# Patient Record
Sex: Female | Born: 1985 | Race: White | Hispanic: No | Marital: Single | Smoking: Never smoker
Health system: Southern US, Community
[De-identification: ages and names within clinical notes are randomized; demographics above are authoritative.]

## PROBLEM LIST (undated history)

## (undated) ENCOUNTER — Inpatient Hospital Stay (HOSPITAL_COMMUNITY): Payer: Self-pay

## (undated) DIAGNOSIS — A749 Chlamydial infection, unspecified: Secondary | ICD-10-CM

## (undated) DIAGNOSIS — R87629 Unspecified abnormal cytological findings in specimens from vagina: Secondary | ICD-10-CM

## (undated) HISTORY — PX: OTHER SURGICAL HISTORY: SHX169

## (undated) HISTORY — DX: Unspecified abnormal cytological findings in specimens from vagina: R87.629

## (undated) HISTORY — PX: COLPOSCOPY: SHX161

---

## 2007-09-29 ENCOUNTER — Encounter (INDEPENDENT_AMBULATORY_CARE_PROVIDER_SITE_OTHER): Payer: Self-pay | Admitting: Otolaryngology

## 2007-09-29 ENCOUNTER — Ambulatory Visit (HOSPITAL_BASED_OUTPATIENT_CLINIC_OR_DEPARTMENT_OTHER): Admission: RE | Admit: 2007-09-29 | Discharge: 2007-09-29 | Payer: Self-pay | Admitting: Otolaryngology

## 2009-03-10 ENCOUNTER — Emergency Department (HOSPITAL_COMMUNITY): Admission: EM | Admit: 2009-03-10 | Discharge: 2009-03-10 | Payer: Self-pay | Admitting: Emergency Medicine

## 2009-04-21 ENCOUNTER — Ambulatory Visit: Payer: Self-pay | Admitting: Advanced Practice Midwife

## 2009-04-21 ENCOUNTER — Inpatient Hospital Stay (HOSPITAL_COMMUNITY): Admission: AD | Admit: 2009-04-21 | Discharge: 2009-04-21 | Payer: Self-pay | Admitting: Obstetrics & Gynecology

## 2010-09-22 ENCOUNTER — Ambulatory Visit: Payer: Self-pay | Admitting: Obstetrics and Gynecology

## 2010-09-22 ENCOUNTER — Encounter: Payer: Self-pay | Admitting: Obstetrics & Gynecology

## 2010-09-22 ENCOUNTER — Other Ambulatory Visit: Admission: RE | Admit: 2010-09-22 | Discharge: 2010-09-22 | Payer: Self-pay | Admitting: Obstetrics & Gynecology

## 2010-09-23 ENCOUNTER — Encounter: Payer: Self-pay | Admitting: Obstetrics & Gynecology

## 2010-09-23 LAB — CONVERTED CEMR LAB: Yeast Wet Prep HPF POC: NONE SEEN

## 2010-10-05 ENCOUNTER — Ambulatory Visit: Payer: Self-pay | Admitting: Obstetrics & Gynecology

## 2011-03-01 LAB — POCT PREGNANCY, URINE: Preg Test, Ur: NEGATIVE

## 2011-03-27 LAB — CBC
Hemoglobin: 11.5 g/dL — ABNORMAL LOW (ref 12.0–15.0)
RBC: 3.88 MIL/uL (ref 3.87–5.11)
WBC: 12.3 10*3/uL — ABNORMAL HIGH (ref 4.0–10.5)

## 2011-03-27 LAB — HCG, QUANTITATIVE, PREGNANCY: hCG, Beta Chain, Quant, S: 4958 m[IU]/mL — ABNORMAL HIGH (ref <5)

## 2011-03-27 LAB — ABO/RH: ABO/RH(D): A POS

## 2011-05-01 NOTE — Op Note (Signed)
NAME:  Jillian George, Jillian George              ACCOUNT NO.:  [id number]   MEDICAL RECORD NO.:  [id number]          PATIENT TYPE:  AMB   LOCATION:  DSC                          FACILITY:  [facility]   PHYSICIAN:  Jefry H. Pollyann Kennedy, MD     DATE OF BIRTH:  1986/02/28   DATE OF PROCEDURE:  09/29/2007  DATE OF DISCHARGE:                               OPERATIVE REPORT   PREOPERATIVE DIAGNOSIS:  Right facial mass.   POSTOPERATIVE DIAGNOSES:  Right facial mass.   PROCEDURE:  Excision of right facial mass.   SURGEON:  Jefry H. Pollyann Kennedy, MD   ANESTHESIA:  General endotracheal.   COMPLICATIONS:  None.   ESTIMATED BLOOD LOSS:  None.   FINDINGS:  A cystic mass consistent with a sebaceous cyst in the right  preauricular skin in the subcutaneous plane superficial to the parotid  fascia.   HISTORY:  A 25 year old with a couple-year history of a slowly enlarging  mass on the right side of the face.  The risks, benefits,  alternatives  and complications to the procedure were explained to the patient and her  mother who seemed to underwent and agreed to surgery.   PROCEDURE:  The patient was taken to the operating room and placed on  the operating room table in supine position.  Following the induction of  general endotracheal anesthesia the right side of the face was prepped  and draped for a parotidectomy.  A preauricular incision with  continuation behind the earlobe and down along the upper cervical area  was outlined with a marking pen in case parotidectomy would be  necessary.  Xylocaine 1% with epinephrine was infiltrated into the skin  and subcutaneous tissue surrounding the lesion using only part of the  incision initially.  A #15 scalpel was used to incise the skin and  subcutaneous tissue.  Careful sharp dissection was accomplished around  the superficial aspect of the cyst.  At 1 point the cyst was ruptured,  and fluid leaked out, but none of the epithelial debris leaked out of  the small puncture  site.   The cyst was completely dissected from surrounding tissue keeping the  entire wall intact. The dissection was superficial to the parotid  fascia.  There were no branches of the facial nerve identified.  There  was no facial stimulation seen during the procedure.  The dissection was  kept anterior to the greater auricular nerve branches.  The  lesion was removed in its entirety and sent for pathologic evaluation.  Bipolar cautery was used for completion of hemostasis. The wound was  irrigated with saline and closed with a running 5-0 nylon suture.   The patient was then awakened, extubated and transferred to Recovery in  stable condition.      Jefry H. Pollyann Kennedy, MD  Electronically Signed     JHR/MEDQ  D:  09/29/2007  T:  09/29/2007  Job:  [id number]

## 2011-09-15 ENCOUNTER — Emergency Department (HOSPITAL_COMMUNITY): Payer: Self-pay

## 2011-09-15 ENCOUNTER — Emergency Department (HOSPITAL_COMMUNITY)
Admission: EM | Admit: 2011-09-15 | Discharge: 2011-09-15 | Disposition: A | Discharge status: Home / Self Care | Payer: Self-pay | Attending: Emergency Medicine | Admitting: Emergency Medicine

## 2011-09-15 DIAGNOSIS — R079 Chest pain, unspecified: Secondary | ICD-10-CM | POA: Insufficient documentation

## 2011-09-15 DIAGNOSIS — T07XXXA Unspecified multiple injuries, initial encounter: Secondary | ICD-10-CM | POA: Insufficient documentation

## 2011-09-15 DIAGNOSIS — M25569 Pain in unspecified knee: Secondary | ICD-10-CM | POA: Insufficient documentation

## 2011-09-15 DIAGNOSIS — IMO0002 Reserved for concepts with insufficient information to code with codable children: Secondary | ICD-10-CM | POA: Insufficient documentation

## 2011-09-15 DIAGNOSIS — F1010 Alcohol abuse, uncomplicated: Secondary | ICD-10-CM | POA: Insufficient documentation

## 2011-09-15 LAB — CBC
Hemoglobin: 13.3 g/dL (ref 12.0–15.0)
MCH: 31.1 pg (ref 26.0–34.0)
MCV: 92 fL (ref 78.0–100.0)
Platelets: 242 10*3/uL (ref 150–400)
RBC: 4.27 MIL/uL (ref 3.87–5.11)
WBC: 11.3 10*3/uL — ABNORMAL HIGH (ref 4.0–10.5)

## 2011-09-15 LAB — URINALYSIS, ROUTINE W REFLEX MICROSCOPIC
Glucose, UA: NEGATIVE mg/dL
Leukocytes, UA: NEGATIVE
Protein, ur: NEGATIVE mg/dL
Specific Gravity, Urine: 1.007 (ref 1.005–1.030)
Urobilinogen, UA: 0.2 mg/dL (ref 0.0–1.0)

## 2011-09-15 LAB — POCT I-STAT, CHEM 8
BUN: 6 mg/dL (ref 6–23)
Chloride: 106 mEq/L (ref 96–112)
Creatinine, Ser: 1 mg/dL (ref 0.50–1.10)
Potassium: 3.3 mEq/L — ABNORMAL LOW (ref 3.5–5.1)
Sodium: 144 mEq/L (ref 135–145)
TCO2: 20 mmol/L (ref 0–100)

## 2011-09-15 LAB — URINE MICROSCOPIC-ADD ON

## 2011-09-15 MED ORDER — IOHEXOL 300 MG/ML  SOLN
100.0000 mL | Freq: Once | INTRAMUSCULAR | Status: AC | PRN
  Administered 2011-09-15: 100 mL via INTRAVENOUS

## 2011-09-17 ENCOUNTER — Encounter: Payer: Self-pay | Admitting: Family Medicine
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
## 2013-12-17 NOTE — L&D Delivery Note (Signed)
Delivery Note 27yo [id number] @ 9644w5d who was admitted for SROM with irregular contractions.  Labor was augmented with Pitocin, the patient received an epidural for pain.  Due to difficulty monitoring contractions, an IUPC was placed.  She progressed to complete dilation and at 7:23 PM a viable female was delivered via Vaginal, Spontaneous Delivery (Presentation: ; Occiput Anterior).  APGAR: 8, 8; weight 5 lb 15.8 oz (2716 g).   Placenta status: Abnormal, Manual removal- Cord separated prematurely, manual extraction of placenta was performed in pieces.  Banjo curettage was gently used to ensure complete removal of placenta.  Uterus was noted to be firm. Cord: 3 vessels with the following complications: None.  Pregnancy complicated by: Subchorionic hematoma in first trimester, followed by US, remained stable and asymptomatic Size less than dates: US @ 591w1d- vertex/EFW: 3#15oz (46%)  Anesthesia: Epidural  Episiotomy: None Lacerations: Labial Suture Repair: 3.0 Est. Blood Loss (mL): 500  Mom to postpartum.  Baby to Couplet care / Skin to Skin.  Myna HidalgoZAN, Nishan Ovens, M 11/02/2014, 1:32 AM

## 2014-04-07 LAB — OB RESULTS CONSOLE ABO/RH: RH Type: POSITIVE

## 2014-04-07 LAB — OB RESULTS CONSOLE ANTIBODY SCREEN: Antibody Screen: NEGATIVE

## 2014-04-07 LAB — OB RESULTS CONSOLE RPR: RPR: NONREACTIVE

## 2014-04-07 LAB — OB RESULTS CONSOLE HIV ANTIBODY (ROUTINE TESTING): HIV: NONREACTIVE

## 2014-04-07 LAB — OB RESULTS CONSOLE HEPATITIS B SURFACE ANTIGEN: Hepatitis B Surface Ag: NEGATIVE

## 2014-04-07 LAB — OB RESULTS CONSOLE RUBELLA ANTIBODY, IGM: RUBELLA: IMMUNE

## 2014-04-19 ENCOUNTER — Other Ambulatory Visit: Payer: Self-pay | Admitting: Obstetrics & Gynecology

## 2014-04-19 ENCOUNTER — Other Ambulatory Visit (HOSPITAL_COMMUNITY)
Admission: RE | Admit: 2014-04-19 | Discharge: 2014-04-19 | Disposition: A | Discharge status: Home / Self Care | Payer: 59 | Source: Ambulatory Visit | Attending: Obstetrics & Gynecology | Admitting: Obstetrics & Gynecology

## 2014-04-19 DIAGNOSIS — Z113 Encounter for screening for infections with a predominantly sexual mode of transmission: Secondary | ICD-10-CM | POA: Insufficient documentation

## 2014-04-19 DIAGNOSIS — Z01419 Encounter for gynecological examination (general) (routine) without abnormal findings: Secondary | ICD-10-CM | POA: Insufficient documentation

## 2014-04-19 LAB — OB RESULTS CONSOLE GC/CHLAMYDIA
Chlamydia: NEGATIVE
Gonorrhea: NEGATIVE

## 2014-05-07 ENCOUNTER — Inpatient Hospital Stay (HOSPITAL_COMMUNITY): Payer: 59

## 2014-05-07 ENCOUNTER — Inpatient Hospital Stay (HOSPITAL_COMMUNITY)
Admission: AD | Admit: 2014-05-07 | Discharge: 2014-05-07 | Disposition: A | Discharge status: Home / Self Care | Payer: 59 | Source: Ambulatory Visit | Attending: Obstetrics & Gynecology | Admitting: Obstetrics & Gynecology

## 2014-05-07 ENCOUNTER — Encounter (HOSPITAL_COMMUNITY): Payer: Self-pay

## 2014-05-07 DIAGNOSIS — O209 Hemorrhage in early pregnancy, unspecified: Secondary | ICD-10-CM

## 2014-05-07 DIAGNOSIS — O208 Other hemorrhage in early pregnancy: Secondary | ICD-10-CM | POA: Insufficient documentation

## 2014-05-07 HISTORY — DX: Chlamydial infection, unspecified: A74.9

## 2014-05-07 NOTE — MAU Provider Note (Signed)
History     CSN: [id number]  Arrival date and time: 05/07/14 1613   None     Chief Complaint  Patient presents with  . Vaginal Bleeding   HPI This is a 28 y.o. female at [redacted]w[redacted]d who presents with c/o gush of blood while at work. Denies cramping. Bleeding has slowed now.   RN Note:  Patient states she was at work and had a little spotting. Put in a tampon then had a gush of bright red blood through the tampon, her panties and pants and made a puddle in the chair. No pain. Bleeding on arrival to MAU small amount on the pad.        OB History   Grav Para Term Preterm Abortions TAB SAB Ect Mult Living   3    2 1 1    0      Past Medical History  Diagnosis Date  . Chlamydia     Past Surgical History  Procedure Laterality Date  . Cyst removed from face      History reviewed. No pertinent family history.  History  Substance Use Topics  . Smoking status: Never Smoker   . Smokeless tobacco: Never Used  . Alcohol Use: No     Comment: None since March 2015    Allergies: No Known Allergies  Prescriptions prior to admission  Medication Sig Dispense Refill  . Prenatal Vit-Fe Fumarate-FA (PRENATAL MULTIVITAMIN) TABS tablet Take 1 tablet by mouth daily at 12 noon.        Review of Systems  Constitutional: Negative for fever, chills and malaise/fatigue.  Gastrointestinal: Negative for nausea, vomiting, abdominal pain, diarrhea and constipation.  Genitourinary:       Vaginal bleeding   Neurological: Negative for dizziness.   Physical Exam   Blood pressure 116/77, pulse 90, temperature 99 F (37.2 C), temperature source Oral, resp. rate 16, height 5' 4.5" (1.638 m), weight 63.231 kg (139 lb 6.4 oz), last menstrual period 02/10/2014.  Physical Exam  Constitutional: She is oriented to person, place, and time. She appears well-developed and well-nourished. No distress.  HENT:  Head: Normocephalic.  Cardiovascular: Normal rate.   Respiratory: Effort normal.  GI: Soft.  There is no tenderness. There is no rebound and no guarding.  Genitourinary: Uterus normal. Vaginal discharge found.  Moderate pooling of blood in vagina Cervix closed  Musculoskeletal: Normal range of motion.  Neurological: She is alert and oriented to person, place, and time.  Skin: Skin is warm and dry.  Psychiatric: She has a normal mood and affect.    MAU Course  Procedures  MDM  US Ob Comp Less 14 Wks  05/07/2014   CLINICAL DATA:  Assess viability.  EXAM: OBSTETRIC <14 WK ULTRASOUND  TECHNIQUE: Transabdominal ultrasound was performed for evaluation of the gestation as well as the maternal uterus and adnexal regions.  COMPARISON:  None.    FINDINGS: Intrauterine gestational sac: Present  Yolk sac:  Present  Embryo:  Present  Cardiac Activity: Present  Heart Rate: 165 bpm  CRL:   48  mm   11 w 5 d                    [place] EDC: 11/21/2014  Maternal uterus/adnexae: Normal bilateral ovaries. There is a 3.1 x 2.7 x 3.4 cm subchorionic hematoma. No significant free fluid identified within the pelvis.    IMPRESSION: Single live intrauterine gestation 12 weeks 2 days by LMP.  There is a 3.4 cm subchorionic hematoma.  Electronically Signed   By: Annia Beltrew  Davis M.D.   On: 05/07/2014 17:43   Results for Rosalyn GessHUTCHERSON, Kayren (MRN [id number]) as of 05/07/2014 21:27  Ref. Range 04/21/2009 18:40  ABO/RH(D) No range found A POS   Assessment and Plan  A:  SIUP at  3829w2d       First trimester bleeding      Subchorionic hemorrhage  P;  Discussed with Dr Richardson Doppole and patient       Pelvic rest       Bleeding precautions       Followup inoffice in 1-2 weeks  Aviva SignsMarie L Luman Holway 05/07/2014, 5:02 PM

## 2014-05-07 NOTE — Discharge Instructions (Signed)
° °Pelvic Rest °Pelvic rest is sometimes recommended for women when:  °· The placenta is partially or completely covering the opening of the cervix (placenta previa). °· There is bleeding between the uterine wall and the amniotic sac in the first trimester (subchorionic hemorrhage). °· The cervix begins to open without labor starting (incompetent cervix, cervical insufficiency). °· The labor is too early (preterm labor). °HOME CARE INSTRUCTIONS °· Do not have sexual intercourse, stimulation, or an orgasm. °· Do not use tampons, douche, or put anything in the vagina. °· Do not lift anything over 10 pounds (4.5 kg). °· Avoid strenuous activity or straining your pelvic muscles. °SEEK MEDICAL CARE IF:  °· You have any vaginal bleeding during pregnancy. Treat this as a potential emergency. °· You have cramping pain felt low in the stomach (stronger than menstrual cramps). °· You notice vaginal discharge (watery, mucus, or bloody). °· You have a low, dull backache. °· There are regular contractions or uterine tightening. °SEEK IMMEDIATE MEDICAL CARE IF: °You have vaginal bleeding and have placenta previa.  °Document Released: 03/30/2011 Document Revised: 02/25/2012 Document Reviewed: 03/30/2011 °ExitCare® Patient Information ©2014 [employer]. ° °Vaginal Bleeding During Pregnancy, First Trimester °A small amount of bleeding (spotting) from the vagina is relatively common in early pregnancy. It usually stops on its own. Various things may cause bleeding or spotting in early pregnancy. Some bleeding may be related to the pregnancy, and some may not. In most cases, the bleeding is normal and is not a problem. However, bleeding can also be a sign of something serious. Be sure to tell your health care provider about any vaginal bleeding right away. °Some possible causes of vaginal bleeding during the first trimester include: °· Infection or inflammation of the cervix. °· Growths (polyps) on the cervix. °· Miscarriage or  threatened miscarriage. °· Pregnancy tissue has developed outside of the uterus and in a fallopian tube (tubal pregnancy). °· Tiny cysts have developed in the uterus instead of pregnancy tissue (molar pregnancy). °HOME CARE INSTRUCTIONS  °Watch your condition for any changes. The following actions may help to lessen any discomfort you are feeling: °· Follow your health care provider's instructions for limiting your activity. If your health care provider orders bed rest, you may need to stay in bed and only get up to use the bathroom. However, your health care provider may allow you to continue light activity. °· If needed, make plans for someone to help with your regular activities and responsibilities while you are on bed rest. °· Keep track of the number of pads you use each day, how often you change pads, and how soaked (saturated) they are. Write this down. °· Do not use tampons. Do not douche. °· Do not have sexual intercourse or orgasms until approved by your health care provider. °· If you pass any tissue from your vagina, save the tissue so you can show it to your health care provider. °· Only take over-the-counter or prescription medicines as directed by your health care provider. °· Do not take aspirin because it can make you bleed. °· Keep all follow-up appointments as directed by your health care provider. °SEEK MEDICAL CARE IF: °· You have any vaginal bleeding during any part of your pregnancy. °· You have cramps or labor pains. °SEEK IMMEDIATE MEDICAL CARE IF:  °· You have severe cramps in your back or belly (abdomen). °· You have a fever, not controlled by medicine. °· You pass large clots or tissue from your vagina. °· Your bleeding   increases. °· You feel lightheaded or weak, or you have fainting episodes. °· You have chills. °· You are leaking fluid or have a gush of fluid from your vagina. °· You pass out while having a bowel movement. °MAKE SURE YOU: °· Understand these instructions. °· Will watch  your condition. °· Will get help right away if you are not doing well or get worse. °Document Released: 09/12/2005 Document Revised: 09/23/2013 Document Reviewed: 08/10/2013 °ExitCare® Patient Information ©2014 [employer]. ° °

## 2014-05-07 NOTE — MAU Note (Signed)
Patient states she was at work and had a little spotting. Put in a tampon then had a gush of bright red blood through the tampon, her panties and pants and made a puddle in the chair. No pain. Bleeding on arrival to MAU small amount on the pad.

## 2014-10-18 ENCOUNTER — Encounter (HOSPITAL_COMMUNITY): Payer: Self-pay

## 2014-10-21 LAB — OB RESULTS CONSOLE GBS: GBS: NEGATIVE

## 2014-10-31 ENCOUNTER — Inpatient Hospital Stay (HOSPITAL_COMMUNITY)
Admission: AD | Admit: 2014-10-31 | Discharge: 2014-11-02 | DRG: 767 | Disposition: A | Discharge status: Home / Self Care | Payer: 59 | Source: Ambulatory Visit | Attending: Obstetrics & Gynecology | Admitting: Obstetrics & Gynecology

## 2014-10-31 DIAGNOSIS — D62 Acute posthemorrhagic anemia: Secondary | ICD-10-CM | POA: Diagnosis not present

## 2014-10-31 DIAGNOSIS — Z3A37 37 weeks gestation of pregnancy: Secondary | ICD-10-CM | POA: Diagnosis present

## 2014-10-31 DIAGNOSIS — O9081 Anemia of the puerperium: Secondary | ICD-10-CM | POA: Diagnosis not present

## 2014-10-31 NOTE — MAU Note (Signed)
Pt reports leaking of fluid since early today. Denies UC's

## 2014-11-01 ENCOUNTER — Encounter (HOSPITAL_COMMUNITY): Payer: Self-pay | Admitting: *Deleted

## 2014-11-01 ENCOUNTER — Inpatient Hospital Stay (HOSPITAL_COMMUNITY): Payer: 59 | Admitting: Anesthesiology

## 2014-11-01 DIAGNOSIS — N858 Other specified noninflammatory disorders of uterus: Secondary | ICD-10-CM | POA: Diagnosis present

## 2014-11-01 DIAGNOSIS — Z3A37 37 weeks gestation of pregnancy: Secondary | ICD-10-CM | POA: Diagnosis present

## 2014-11-01 DIAGNOSIS — D62 Acute posthemorrhagic anemia: Secondary | ICD-10-CM | POA: Diagnosis not present

## 2014-11-01 DIAGNOSIS — O9081 Anemia of the puerperium: Secondary | ICD-10-CM | POA: Diagnosis not present

## 2014-11-01 LAB — CBC
HEMATOCRIT: 36 % (ref 36.0–46.0)
HEMOGLOBIN: 12.3 g/dL (ref 12.0–15.0)
MCH: 31.9 pg (ref 26.0–34.0)
MCHC: 34.2 g/dL (ref 30.0–36.0)
MCV: 93.3 fL (ref 78.0–100.0)
Platelets: 166 10*3/uL (ref 150–400)
RBC: 3.86 MIL/uL — AB (ref 3.87–5.11)
RDW: 12.8 % (ref 11.5–15.5)
WBC: 14.3 10*3/uL — ABNORMAL HIGH (ref 4.0–10.5)

## 2014-11-01 LAB — RPR

## 2014-11-01 LAB — POCT FERN TEST: POCT Fern Test: POSITIVE

## 2014-11-01 LAB — RAPID HIV SCREEN (WH-MAU): Rapid HIV Screen: NONREACTIVE

## 2014-11-01 MED ORDER — ACETAMINOPHEN 325 MG PO TABS: 650.0000 mg | ORAL_TABLET | ORAL | Status: DC | PRN

## 2014-11-01 MED ORDER — FENTANYL CITRATE 0.05 MG/ML IJ SOLN
INTRAMUSCULAR | Status: AC
  Administered 2014-11-01: 100 ug
  Filled 2014-11-01: qty 2

## 2014-11-01 MED ORDER — LANOLIN HYDROUS EX OINT: TOPICAL_OINTMENT | CUTANEOUS | Status: DC | PRN

## 2014-11-01 MED ORDER — OXYTOCIN 40 UNITS IN LACTATED RINGERS INFUSION - SIMPLE MED
62.5000 mL/h | INTRAVENOUS | Status: DC
  Administered 2014-11-01: 62.5 mL/h via INTRAVENOUS
  Filled 2014-11-01: qty 1000

## 2014-11-01 MED ORDER — CITRIC ACID-SODIUM CITRATE 334-500 MG/5ML PO SOLN: 30.0000 mL | ORAL | Status: DC | PRN

## 2014-11-01 MED ORDER — LACTATED RINGERS IV SOLN
INTRAVENOUS | Status: DC
  Administered 2014-11-01 (×3): via INTRAVENOUS
  Administered 2014-11-01: 125 mL/h via INTRAVENOUS

## 2014-11-01 MED ORDER — IBUPROFEN 600 MG PO TABS
600.0000 mg | ORAL_TABLET | Freq: Four times a day (QID) | ORAL | Status: DC
  Administered 2014-11-02 (×2): 600 mg via ORAL
  Filled 2014-11-01 (×4): qty 1

## 2014-11-01 MED ORDER — FENTANYL 2.5 MCG/ML BUPIVACAINE 1/10 % EPIDURAL INFUSION (WH - ANES)
14.0000 mL/h | INTRAMUSCULAR | Status: DC | PRN
  Administered 2014-11-01 (×2): 14 mL/h via EPIDURAL
  Filled 2014-11-01 (×3): qty 125

## 2014-11-01 MED ORDER — EPHEDRINE 5 MG/ML INJ
10.0000 mg | INTRAVENOUS | Status: DC | PRN
  Filled 2014-11-01: qty 2

## 2014-11-01 MED ORDER — LACTATED RINGERS IV SOLN
500.0000 mL | Freq: Once | INTRAVENOUS | Status: AC
  Administered 2014-11-01: 1000 mL via INTRAVENOUS

## 2014-11-01 MED ORDER — ONDANSETRON HCL 4 MG/2ML IJ SOLN: 4.0000 mg | INTRAMUSCULAR | Status: DC | PRN

## 2014-11-01 MED ORDER — WITCH HAZEL-GLYCERIN EX PADS: 1.0000 "application " | MEDICATED_PAD | CUTANEOUS | Status: DC | PRN

## 2014-11-01 MED ORDER — LIDOCAINE HCL (PF) 1 % IJ SOLN
INTRAMUSCULAR | Status: DC | PRN
  Administered 2014-11-01 (×2): 5 mL

## 2014-11-01 MED ORDER — OXYCODONE-ACETAMINOPHEN 5-325 MG PO TABS: 1.0000 | ORAL_TABLET | ORAL | Status: DC | PRN

## 2014-11-01 MED ORDER — ONDANSETRON HCL 4 MG PO TABS: 4.0000 mg | ORAL_TABLET | ORAL | Status: DC | PRN

## 2014-11-01 MED ORDER — ZOLPIDEM TARTRATE 5 MG PO TABS: 5.0000 mg | ORAL_TABLET | Freq: Every evening | ORAL | Status: DC | PRN

## 2014-11-01 MED ORDER — SENNOSIDES-DOCUSATE SODIUM 8.6-50 MG PO TABS
2.0000 | ORAL_TABLET | ORAL | Status: DC
  Filled 2014-11-01: qty 2

## 2014-11-01 MED ORDER — BENZOCAINE-MENTHOL 20-0.5 % EX AERO
1.0000 "application " | INHALATION_SPRAY | CUTANEOUS | Status: DC | PRN
  Filled 2014-11-01: qty 56

## 2014-11-01 MED ORDER — PHENYLEPHRINE 40 MCG/ML (10ML) SYRINGE FOR IV PUSH (FOR BLOOD PRESSURE SUPPORT)
80.0000 ug | PREFILLED_SYRINGE | INTRAVENOUS | Status: DC | PRN
Start: 2014-11-01 — End: 2014-11-01
  Filled 2014-11-01: qty 2

## 2014-11-01 MED ORDER — LACTATED RINGERS IV SOLN
500.0000 mL | INTRAVENOUS | Status: DC | PRN
  Administered 2014-11-01: 1000 mL via INTRAVENOUS

## 2014-11-01 MED ORDER — TERBUTALINE SULFATE 1 MG/ML IJ SOLN: 0.2500 mg | Freq: Once | INTRAMUSCULAR | Status: DC | PRN

## 2014-11-01 MED ORDER — DIPHENHYDRAMINE HCL 25 MG PO CAPS: 25.0000 mg | ORAL_CAPSULE | Freq: Four times a day (QID) | ORAL | Status: DC | PRN

## 2014-11-01 MED ORDER — OXYCODONE-ACETAMINOPHEN 5-325 MG PO TABS: 2.0000 | ORAL_TABLET | ORAL | Status: DC | PRN

## 2014-11-01 MED ORDER — DIBUCAINE 1 % RE OINT: 1.0000 "application " | TOPICAL_OINTMENT | RECTAL | Status: DC | PRN

## 2014-11-01 MED ORDER — ONDANSETRON HCL 4 MG/2ML IJ SOLN: 4.0000 mg | Freq: Four times a day (QID) | INTRAMUSCULAR | Status: DC | PRN

## 2014-11-01 MED ORDER — PRENATAL MULTIVITAMIN CH
1.0000 | ORAL_TABLET | Freq: Every day | ORAL | Status: DC
  Administered 2014-11-02: 1 via ORAL
  Filled 2014-11-01: qty 1

## 2014-11-01 MED ORDER — OXYTOCIN 40 UNITS IN LACTATED RINGERS INFUSION - SIMPLE MED
1.0000 m[IU]/min | INTRAVENOUS | Status: DC
  Administered 2014-11-01: 2 m[IU]/min via INTRAVENOUS

## 2014-11-01 MED ORDER — LIDOCAINE HCL (PF) 1 % IJ SOLN
30.0000 mL | INTRAMUSCULAR | Status: DC | PRN
  Filled 2014-11-01: qty 30

## 2014-11-01 MED ORDER — SIMETHICONE 80 MG PO CHEW
80.0000 mg | CHEWABLE_TABLET | ORAL | Status: DC | PRN
  Filled 2014-11-01: qty 1

## 2014-11-01 MED ORDER — PHENYLEPHRINE 40 MCG/ML (10ML) SYRINGE FOR IV PUSH (FOR BLOOD PRESSURE SUPPORT)
80.0000 ug | PREFILLED_SYRINGE | INTRAVENOUS | Status: DC | PRN
  Filled 2014-11-01: qty 10
  Filled 2014-11-01: qty 2

## 2014-11-01 MED ORDER — DIPHENHYDRAMINE HCL 50 MG/ML IJ SOLN: 12.5000 mg | INTRAMUSCULAR | Status: DC | PRN

## 2014-11-01 MED ORDER — OXYTOCIN BOLUS FROM INFUSION
500.0000 mL | INTRAVENOUS | Status: DC
  Administered 2014-11-01: 500 mL via INTRAVENOUS

## 2014-11-01 MED ORDER — FLEET ENEMA 7-19 GM/118ML RE ENEM: 1.0000 | ENEMA | RECTAL | Status: DC | PRN

## 2014-11-01 MED ORDER — FENTANYL CITRATE 0.05 MG/ML IJ SOLN
INTRAMUSCULAR | Status: AC
  Filled 2014-11-01: qty 2

## 2014-11-01 NOTE — Anesthesia Procedure Notes (Signed)
Epidural Patient location during procedure: OB Start time: 11/01/2014 8:05 AM  Staffing Anesthesiologist: Brayton CavesJACKSON, Fahmida Jurich Performed by: anesthesiologist   Preanesthetic Checklist Completed: patient identified, site marked, surgical consent, pre-op evaluation, timeout performed, IV checked, risks and benefits discussed and monitors and equipment checked  Epidural Patient position: sitting Prep: site prepped and draped and DuraPrep Patient monitoring: continuous pulse ox and blood pressure Approach: midline Location: L3-L4 Injection technique: LOR air  Needle:  Needle type: Tuohy  Needle gauge: 17 G Needle length: 9 cm and 9 Needle insertion depth: 5 cm cm Catheter type: closed end flexible Catheter size: 19 Gauge Catheter at skin depth: 10 cm Test dose: negative  Assessment Events: blood not aspirated, injection not painful, no injection resistance, negative IV test and no paresthesia  Additional Notes Patient identified.  Risk benefits discussed including failed block, incomplete pain control, headache, nerve damage, paralysis, blood pressure changes, nausea, vomiting, reactions to medication both toxic or allergic, and postpartum back pain.  Patient expressed understanding and wished to proceed.  All questions were answered.  Sterile technique used throughout procedure and epidural site dressed with sterile barrier dressing. No paresthesia or other complications noted.The patient did not experience any signs of intravascular injection such as tinnitus or metallic taste in mouth nor signs of intrathecal spread such as rapid motor block. Please see nursing notes for vital signs.

## 2014-11-01 NOTE — Progress Notes (Signed)
OB PN:  S: Pt resting comfortably, no acute complaints  O: BP 133/80 mmHg  Pulse 76  Temp(Src) 98.4 F (36.9 C) (Oral)  Resp 20  Ht 5\' 6"  (1.676 m)  Wt 78.019 kg (172 lb)  BMI 27.77 kg/m2  SpO2 99%  LMP 02/10/2014  FHT: 120bpm, moderate variablity, + accels 10x10 , no decels Toco: q1-432min SVE: ant lip/C/+2 per RN  A/P: 28 y.o. [id number]@G3P0020@ 6948w5d who presented in active labor 1. FWB: Cat. I  2. Labor: continue Pit per protocol Pain: continue with epidural GBS: negative  Myna HidalgoJennifer Carianna Lague, DO [phone] (pager) [phone] (office)

## 2014-11-01 NOTE — Progress Notes (Signed)
OB PN:  S: Pt resting comfortably, no acute complaints  O: BP 117/74 mmHg  Pulse 69  Temp(Src) 97.8 F (36.6 C) (Oral)  Resp 20  Ht 5\' 6"  (1.676 m)  Wt 78.019 kg (172 lb)  BMI 27.77 kg/m2  SpO2 99%  LMP 02/10/2014  FHT: 130bpm, minimal to moderate variablity, no accels, early decels Toco: q1-522min SVE: 3/75/-2, AROM of forebag clear fluid  A/P: 28 y.o. [id number]@G3P0020@ 6216w5d who presented in latent labor 1. FWB: Cat. II due to variability, likely due to tachysystole, will give fluid bolus and cut back on pitocin 2. Labor: continue Pit, will decrease slightly due to tachysystole Pain: continue with epidural GBS: negative  Myna HidalgoJennifer Alira Fretwell, DO [phone] (pager) [phone] (office)

## 2014-11-01 NOTE — MAU Provider Note (Signed)
S: Naveah Rodriges is a 28 y.o. G3P0020 at 7335w5d who presents today with leaking of fluid. She denies any VB. She confirms fetal movement. O: VSS, afebrile Abdomen: soft, non-tender, gravid External: no lesion Vagina: pooling of clear fluid  Cervix: 1/70/-2 Uterus: AGA FHT: 120, moderate with 15x15 accels, no decels  Toco: 2-4 mins, mild. Patient not aware  Fern: positive A/P: Exam for ROM RN will report to attending MD

## 2014-11-01 NOTE — Anesthesia Preprocedure Evaluation (Signed)

## 2014-11-01 NOTE — H&P (Signed)
HPI: 28 y/o G3P0020 @ 7171w5d estimated gestational age (as dated by LMP c/w 7 week ultrasound) presents complaining of leaking of fluid around 2:30am.   + Leaking of Fluid,   no Vaginal Bleeding,   irregular Uterine Contractions,  + Fetal Movement.  ROS: no HA, no epigastric pain, no visual changes.    Pregnancy complicated by: Subchorionic hematoma in first trimester, followed by US, remained stable and asymptomatic Size less than dates: US @ 583w1d- vertex/EFW: 3#15oz (46%)    Prenatal Transfer Tool  Maternal Diabetes: No Genetic Screening: Declined Maternal Ultrasounds/Referrals: Normal Fetal Ultrasounds or other Referrals:  None Maternal Substance Abuse:  No Significant Maternal Medications:  None Significant Maternal Lab Results: Lab values include: Group B Strep negative   PNL:  GBS negative, Rub Immune, Hep B neg, RPR NR, HIV neg, GC/C neg, glucola:normal, declined tetra Blood type: A positive Rc'd Tdap vaccine: 09/23/14  OBHx: primip PMHx:  none Meds:  PNV Allergy:  No Known Allergies SurgHx: none SocHx:   no Tobacco, no  EtOH, no Illicit Drugs  O: BP 140/78 mmHg  Pulse 107  Temp(Src) 98.1 F (36.7 C) (Oral)  Resp 18  Ht 5\' 6"  (1.676 m)  Wt 78.019 kg (172 lb)  BMI 27.77 kg/m2  LMP 02/10/2014 Gen. AAOx3, NAD CV.  RRR  No murmur.  Resp. CTAB, no wheeze or crackles. Abd. Gravid,  no tenderness,  no rigidity,  no guarding Extr.  no edema B/L , no calf tenderness  FHT: 115 baseline, moderate variability, + accels,  no decels Toco: q 3-5 min SVE: 1-2/60/-3 per RN   Labs:  CBC    Component Value Date/Time   WBC 14.3* 11/01/2014 0110   RBC 3.86* 11/01/2014 0110   HGB 12.3 11/01/2014 0110   HCT 36.0 11/01/2014 0110   PLT 166 11/01/2014 0110   MCV 93.3 11/01/2014 0110   MCH 31.9 11/01/2014 0110   MCHC 34.2 11/01/2014 0110   RDW 12.8 11/01/2014 0110    A/P:  28 y.o. G3P0020 @ 5471w5d EGA who presents for SROM in latent labor -FWB:  NICHD Cat I FHTs -Labor:  Pitocin was started due to irregular contractions, continue per protocol -GBS: negative -Pain management: plan for epidural this am  Myna HidalgoJennifer Kamarrion Stfort, DO [phone] (pager) [phone] (office)

## 2014-11-02 LAB — HEMOGLOBIN AND HEMATOCRIT, BLOOD
HEMATOCRIT: 22.2 % — AB (ref 36.0–46.0)
Hemoglobin: 7.5 g/dL — ABNORMAL LOW (ref 12.0–15.0)

## 2014-11-02 LAB — CBC
HCT: 25.9 % — ABNORMAL LOW (ref 36.0–46.0)
HEMOGLOBIN: 8.9 g/dL — AB (ref 12.0–15.0)
MCH: 32.2 pg (ref 26.0–34.0)
MCHC: 34.4 g/dL (ref 30.0–36.0)
MCV: 93.8 fL (ref 78.0–100.0)
Platelets: 163 10*3/uL (ref 150–400)
RBC: 2.76 MIL/uL — ABNORMAL LOW (ref 3.87–5.11)
RDW: 13 % (ref 11.5–15.5)
WBC: 26.3 10*3/uL — ABNORMAL HIGH (ref 4.0–10.5)

## 2014-11-02 MED ORDER — FERROUS SULFATE 325 (65 FE) MG PO TABS: 325.0000 mg | ORAL_TABLET | Freq: Two times a day (BID) | ORAL | Status: DC

## 2014-11-02 MED ORDER — IBUPROFEN 600 MG PO TABS: 600.0000 mg | ORAL_TABLET | Freq: Four times a day (QID) | ORAL | Status: DC

## 2014-11-02 MED ORDER — MISOPROSTOL 200 MCG PO TABS
200.0000 ug | ORAL_TABLET | Freq: Once | ORAL | Status: AC
  Administered 2014-11-02: 200 ug via ORAL
  Filled 2014-11-02: qty 1

## 2014-11-02 MED ORDER — MISOPROSTOL 200 MCG PO TABS: 200.0000 ug | ORAL_TABLET | Freq: Four times a day (QID) | ORAL | Status: DC

## 2014-11-02 MED ORDER — FERROUS SULFATE 325 (65 FE) MG PO TABS
325.0000 mg | ORAL_TABLET | Freq: Two times a day (BID) | ORAL | Status: DC
  Administered 2014-11-02 (×2): 325 mg via ORAL
  Filled 2014-11-02 (×2): qty 1

## 2014-11-02 NOTE — Discharge Summary (Signed)
Obstetric Discharge Summary Reason for Admission: rupture of membranes Prenatal Procedures: ultrasound Intrapartum Procedures: spontaneous vaginal delivery, manual extraction of placenta, bedside currettage Postpartum Procedures: None Complications-Operative and Postpartum: labial laceration HEMOGLOBIN  Date Value Ref Range Status  11/02/2014 7.5* 12.0 - 15.0 g/dL Final   HCT  Date Value Ref Range Status  11/02/2014 22.2* 36.0 - 46.0 % Final    Physical Exam:  See Progress notes.  Hg 12 to 8.9 to 7.5 VSS stable, No s/sxs of anemia.  Discharge Diagnoses: Term Pregnancy-delivered, Postpartum hemorrhage.  Discharge Information: Date: 11/02/2014 Activity: pelvic rest Diet: routine Medications: PNV, Ibuprofen, Iron and Cytotec Condition: stable Instructions: See discharge instructions. Discharge to: home Follow-up Information    Follow up with Myna HidalgoZAN, JENNIFER, M, DO. Schedule an appointment as soon as possible for a visit in 2 days.   Specialty:  Obstetrics and Gynecology   Why:  Check anemia   Contact information:   [address] [place] [id number] [phone]       Newborn Data: Live born female  Birth Weight: 5 lb 15.8 oz (2716 g) APGAR: 8, 8  [facility] D[facility] due to imperforate anus.  Geryl RankinsVARNADO, Toyoko Silos 11/02/2014, 5:57 PM

## 2014-11-02 NOTE — Progress Notes (Signed)
Postpartum day #1, NSVD  Subjective Overnight at ~0200, [facility].  Pt is requesting an early discharge to be with her baby. Pt without complaints.  Lochia normal, not passing clots.  Pain controlled.  Breast feeding yes.  Pt has received instructions how to pump.  Pt does not report lightheadedness or SOB.  Temp:  [97.8 F (36.6 C)-100.1 F (37.8 C)] 98.2 F (36.8 C) (11/17 0320) Pulse Rate:  [57-108] 89 (11/17 0320) Resp:  [18-20] 18 (11/17 0320) BP: (97-145)/(52-92) 117/63 mmHg (11/17 0320) SpO2:  [99 %-100 %] 99 % (11/17 0320)  Gen:  NAD, A&O x 3 Uterine fundus:  Firm, nontender Lochia:  Pad with scant amount of blood Ext:  1+Edema, no calf tenderness bilaterally  CBC    Component Value Date/Time   WBC 26.3* 11/02/2014 0600   RBC 2.76* 11/02/2014 0600   HGB 8.9* 11/02/2014 0600   HCT 25.9* 11/02/2014 0600   PLT 163 11/02/2014 0600   MCV 93.8 11/02/2014 0600   MCH 32.2 11/02/2014 0600   MCHC 34.4 11/02/2014 0600   RDW 13.0 11/02/2014 0600     A/P: S/p SVD, manual extraction of placenta with bedside curettage. PP hemorrhage.  Anemia but pt is asymptomatic. Newborn with imperforate anus.  [facility]  No concerns regarding bleeding at this time.  I recommend pt be observed overnight.  However, given the circumstances, I recommend a minimum of 24 hour observation to assess bleeding.  Bleeding precautions reviewed. Lactation support. Discharge possibly later today after reevaluating.  Geryl RankinsVARNADO, Jacia Sickman 11/02/2014, 8:32 AM

## 2014-11-02 NOTE — Discharge Instructions (Addendum)
Vaginal Delivery, Care After °Refer to this sheet in the next few weeks. These discharge instructions provide you with information on caring for yourself after delivery. Your caregiver may also give you specific instructions. Your treatment has been planned according to the most current medical practices available, but problems sometimes occur. Call your caregiver if you have any problems or questions after you go home. °HOME CARE INSTRUCTIONS °· Take over-the-counter or prescription medicines only as directed by your caregiver or pharmacist. °· Do not drink alcohol, especially if you are breastfeeding or taking medicine to relieve pain. °· Do not chew or smoke tobacco. °· Do not use illegal drugs. °· Continue to use good perineal care. Good perineal care includes: °· Wiping your perineum from front to back. °· Keeping your perineum clean. °· Do not use tampons or douche until your caregiver says it is okay. °· Shower, wash your hair, and take tub baths as directed by your caregiver. °· Wear a well-fitting bra that provides breast support. °· Eat healthy foods. °· Drink enough fluids to keep your urine clear or pale yellow. °· Eat high-fiber foods such as whole grain cereals and breads, brown rice, beans, and fresh fruits and vegetables every day. These foods may help prevent or relieve constipation. °· Follow your caregiver's recommendations regarding resumption of activities such as climbing stairs, driving, lifting, exercising, or traveling. °· Talk to your caregiver about resuming sexual activities. Resumption of sexual activities is dependent upon your risk of infection, your rate of healing, and your comfort and desire to resume sexual activity. °· Try to have someone help you with your household activities and your newborn for at least a few days after you leave the hospital. °· Rest as much as possible. Try to rest or take a nap when your newborn is sleeping. °· Increase your activities gradually. °· Keep  all of your scheduled postpartum appointments. It is very important to keep your scheduled follow-up appointments. At these appointments, your caregiver will be checking to make sure that you are healing physically and emotionally. °SEEK MEDICAL CARE IF:  °· You are passing large clots from your vagina. Save any clots to show your caregiver. °· You have a foul smelling discharge from your vagina. °· You have trouble urinating. °· You are urinating frequently. °· You have pain when you urinate. °· You have a change in your bowel movements. °· You have increasing redness, pain, or swelling near your vaginal incision (episiotomy) or vaginal tear. °· You have pus draining from your episiotomy or vaginal tear. °· Your episiotomy or vaginal tear is separating. °· You have painful, hard, or reddened breasts. °· You have a severe headache. °· You have blurred vision or see spots. °· You feel sad or depressed. °· You have thoughts of hurting yourself or your newborn. °· You have questions about your care, the care of your newborn, or medicines. °· You are dizzy or light-headed. °· You have a rash. °· You have nausea or vomiting. °· You were breastfeeding and have not had a menstrual period within 12 weeks after you stopped breastfeeding. °· You are not breastfeeding and have not had a menstrual period by the 12th week after delivery. °· You have a fever. °SEEK IMMEDIATE MEDICAL CARE IF:  °· You have persistent pain. °· You have chest pain. °· You have shortness of breath. °· You faint. °· You have leg pain. °· You have stomach pain. °· Your vaginal bleeding saturates two or more sanitary pads   in 1 hour. MAKE SURE YOU:   Understand these instructions.  Will watch your condition.  Will get help right away if you are not doing well or get worse. Document Released: 11/30/2000 Document Revised: 04/19/2014 Document Reviewed: 07/30/2012 [facility] Patient Information 2015 [place], [place]. This information is not intended to  replace advice given to you by your health care provider. Make sure you discuss any questions you have with your health care provider.  Postpartum Hemorrhage Postpartum hemorrhage is excessive blood loss after childbirth. Some blood loss is normal after delivering a baby. However, postpartum hemorrhage is a potentially serious condition.  CAUSES   A loss of muscle tone in the uterus after childbirth.  Failure to deliver all of the placenta.  Wounds in the birth canal caused by delivery of the fetus.  A maternal bleeding disorder that prevents blood clotting (rare). RISK FACTORS You are at greater risk for postpartum hemorrhage if you:  Have a history of postpartum hemorrhage.  Have delivered more than one baby.  Had preeclampsia or eclampsia.  Had problems with the placenta.  Had complications during your labor or delivery.  Are obese.  Are Asian or Hispanic. SIGNS AND SYMPTOMS  Vaginal bleeding after delivery is normal and should be expected. Bleeding (lochia) will occur for several days after childbirth. This can be expected with normal vaginal deliveries and cesarean deliveries.  You are bleeding too much after your delivery if you are:  Passing large clots or pieces of tissue. This may be small pieces of placenta left after delivery.   Soaking more than one sanitary pad per hour for several hours.   Having heavy, bright-red bleeding that occurs 4 days or more after delivery.   Having a discharge that has a bad smell or if you begin to run an unexplained fever.   Having times of lightheadedness or fainting, feeling short of breath, or having your heart beat fast with very little activity.  DIAGNOSIS  A diagnosis is based on your symptoms and a physical exam of your perineum, vagina, cervix, and uterus. Diagnostic tests may include:  Blood pressure and pulse.  Blood tests.  Blood clotting tests.  Ultrasonography. TREATMENT  Treatment is based on the  severity of bleeding and may include:  Uterine massage.  Medicines.  Blood transfusions.  Sometimes bleeding occurs if portions of the placenta are left behind in the uterus after delivery. If this happens, often a curettage or scraping of the inside of the uterus must be done. This usually stops the bleeding. If this treatment does not stop the bleeding, surgery (hysterectomy) may have to be performed to remove the uterus.  If bleeding is due to clotting or bleeding problems that are not related to the pregnancy, other treatments may be needed.  HOME CARE INSTRUCTIONS   Limit your activity as directed by your health care provider.Your health care provider may order bed rest (getting up to the bathroom only) or may allow you to continue light activity.   Keep track of the number of pads you use each day and how soaked (saturated) they are. Write this number down.   Do not use tampons. Do not douche or have sexual intercourse until approved by your health care provider.   Drink enough fluids to keep your urine clear or pale yellow.   Get proper amounts of rest.   Eat foods that are rich in iron, such as spinach, red meat, and legumes.  SEEK IMMEDIATE MEDICAL CARE IF:  You experience severe  cramps in your stomach, back, or belly (abdomen).   You have a fever.   You pass large clots or tissue. Save any tissue for your health care provider to look at.   Your bleeding increases.  You become weak or lightheaded, or you pass out.   Your sanitary pad count per hour is increasing. MAKE SURE YOU:  Understand these instructions.  Will watch your condition.  Will get help right away if you are not doing well or get worse. Document Released: 02/23/2004 Document Revised: 12/08/2013 Document Reviewed: 05/21/2013 [facility] Patient Information 2015 [place], [place]. This information is not intended to replace advice given to you by your health care provider. Make sure you  discuss any questions you have with your health care provider.

## 2014-11-02 NOTE — Anesthesia Postprocedure Evaluation (Signed)
Anesthesia Post Note  Patient: Jillian George  Procedure(s) Performed: * No procedures listed *  Anesthesia type: Epidural  Patient location: Mother/Baby  Post pain: Pain level controlled  Post assessment: Post-op Vital signs reviewed  Last Vitals:  Filed Vitals:   11/02/14 0320  BP: 117/63  Pulse: 89  Temp: 36.8 C  Resp: 18    Post vital signs: Reviewed  Level of consciousness: awake  Complications: No apparent anesthesia complications

## 2014-11-02 NOTE — Plan of Care (Signed)
Problem: Phase I Progression Outcomes Goal: Pain controlled with appropriate interventions Outcome: Completed/Met Date Met:  11/02/14 Goal: OOB as tolerated unless otherwise ordered Outcome: Completed/Met Date Met:  11/02/14 Goal: Initial discharge plan identified Outcome: Completed/Met Date Met:  11/02/14

## 2014-11-02 NOTE — Progress Notes (Addendum)
Informed by RN that Hg at 1500 was 7.5.  On admission was 12.5.  0600 today 8.9. Discussed with RN.  States that pt's lochia has been normal, uterus firm.  Pt has denied symptoms of anemia.She is taking iron.  VS were stable. Cytotec ordered po once.  Will call and speak to pt regarding her lab tests and severe anemia.  Will encourage repeat hg in am and discharge in am.

## 2014-11-02 NOTE — Progress Notes (Signed)
Postpartum day #1, NSVD  Subjective  In to reassess pt. Pt says bleeding is still normal.  Not passing clots, no SOB, dizzy.  Current pad has been in place ~ 2 hours.  Temp:  [98.2 F (36.8 C)-100.1 F (37.8 C)] 98.2 F (36.8 C) (11/17 0320) Pulse Rate:  [73-108] 89 (11/17 0320) Resp:  [18-20] 18 (11/17 0320) BP: (104-145)/(53-91) 117/63 mmHg (11/17 0320) SpO2:  [99 %-100 %] 99 % (11/17 0320)  Gen:  NAD, A&O x 3 Uterine fundus:  Firm, nontender Lochia:  Moderate amount of blood on ice pack Ext:  Not assessed  CBC    Component Value Date/Time   WBC 26.3* 11/02/2014 0600   RBC 2.76* 11/02/2014 0600   HGB 7.5* 11/02/2014 1503   HCT 22.2* 11/02/2014 1503   PLT 163 11/02/2014 0600   MCV 93.8 11/02/2014 0600   MCH 32.2 11/02/2014 0600   MCHC 34.4 11/02/2014 0600   RDW 13.0 11/02/2014 0600     A/P: S/p SVD, manual extraction of placenta, bedside currettage. PP hemorrhage.  Lochia appears normal.  No signs or symptoms of uterine atony. Recheck CBC.  If stable, discharge home.  Geryl RankinsVARNADO, Lorraine Terriquez 11/02/2014, 5:14 PM

## 2014-11-02 NOTE — Lactation Note (Signed)
Lactation Consultation Note  Assisted mother with hand expression. Able to easily express 20 ml of colostrum.  Mother plans to contact her insurance company to find out if she has breast pump benefits.  I explained the 2 week loaner program to her and the monthly rental program.  Once she finds out her coverage she may decide whether she will rent or purchase a breast pump. Patient Name: Jillian George ZOXWR'UToday's Date: 11/02/2014     Maternal Data    Feeding    LATCH Score/Interventions                      Lactation Tools Discussed/Used     Consult Status      Jillian George, Jillian George 11/02/2014, 4:18 PM

## 2014-11-02 NOTE — Consult Note (Signed)
Lactation consult note - [facility] She is expressing large amounts of colostrum. I rented her a 2 week symphony DEP and instructed her in it's use. NICU booklet on providing EBm given to mom and briefly reviewed. Mom knows she can call lactation once baby is discharged and home, for questions/concerns and o/p consults, prn.

## 2014-11-02 NOTE — Progress Notes (Signed)
Patient ordered Hgb and Hct lab work  by Dr. Dion BodyVarnado. Results phoned to Dr. Dion BodyVarnado. Hgb 7.5 and Hct 22.2

## 2014-11-02 NOTE — Plan of Care (Signed)
Problem: Discharge Progression Outcomes Goal: Barriers To Progression Addressed/Resolved Outcome: Completed/Met Date Met:  11/02/14 Goal: Activity appropriate for discharge plan Outcome: Completed/Met Date Met:  11/02/14 Goal: Tolerating diet Outcome: Completed/Met Date Met:  30/14/[birth year] Goal: Complications resolved/controlled Outcome: Completed/Met Date Met:  11/02/14 Goal: Pain controlled with appropriate interventions Outcome: Completed/Met Date Met:  11/02/14 Goal: Afebrile, VS remain stable at discharge Outcome: Completed/Met Date Met:  11/02/14 Goal: Discharge plan in place and appropriate Outcome: Completed/Met Date Met:  11/02/14 Goal: Other Discharge Outcomes/Goals Outcome: Completed/Met Date Met:  11/02/14

## 2014-11-02 NOTE — Plan of Care (Signed)
Problem: Consults Goal: Postpartum Patient Education (See Patient Education module for education specifics.)  Outcome: Completed/Met Date Met:  11/02/14

## 2014-11-02 NOTE — Plan of Care (Signed)
Problem: Phase I Progression Outcomes Goal: Voiding adequately Outcome: Completed/Met Date Met:  11/02/14 Goal: VS, stable, temp < 100.4 degrees F Outcome: Completed/Met Date Met:  11/02/14 Goal: Other Phase I Outcomes/Goals Outcome: Completed/Met Date Met:  11/02/14  Problem: Phase II Progression Outcomes Goal: Pain controlled on oral analgesia Outcome: Completed/Met Date Met:  11/02/14 Goal: Afebrile, VS remain stable Outcome: Completed/Met Date Met:  11/02/14 Goal: Tolerating diet Outcome: Completed/Met Date Met:  11/02/14 Goal: Other Phase II Outcomes/Goals Outcome: Completed/Met Date Met:  11/02/14

## 2014-11-02 NOTE — Plan of Care (Signed)
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 Patient had been tearful per night shift RN. Patient has a good support system and would like to be discharged this evening around 24 hour delivery time if OB MD is good with her progression postpartum,

## 2014-11-14 MED ORDER — FENTANYL CITRATE 0.05 MG/ML IJ SOLN: 100.0000 ug | Freq: Once | INTRAMUSCULAR | Status: AC

## 2016-04-07 IMAGING — US US OB COMP LESS 14 WK
1 series · 14 of 23 positions shown · non-contrast
Comparison: None.

CLINICAL DATA: Assess viability.

EXAM:
OBSTETRIC <14 WK ULTRASOUND
TECHNIQUE: Transabdominal ultrasound was performed for evaluation of the
gestation as well as the maternal uterus and adnexal regions.

[Series 1: us ob comp less 14 wks · 23 acquisitions, 14 frames shown]
[im 1/23]
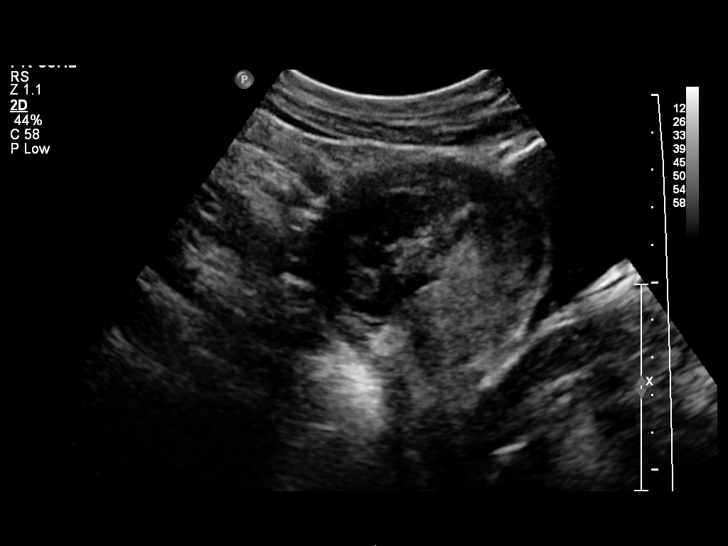
[im 3/23]
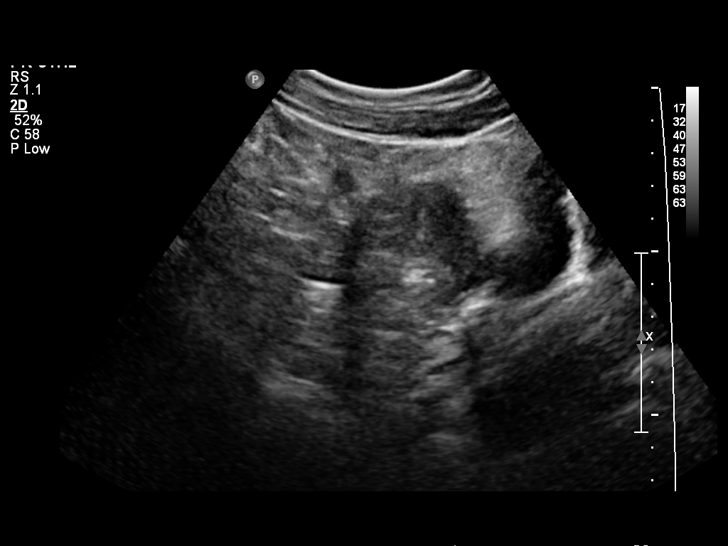
[im 5/23]
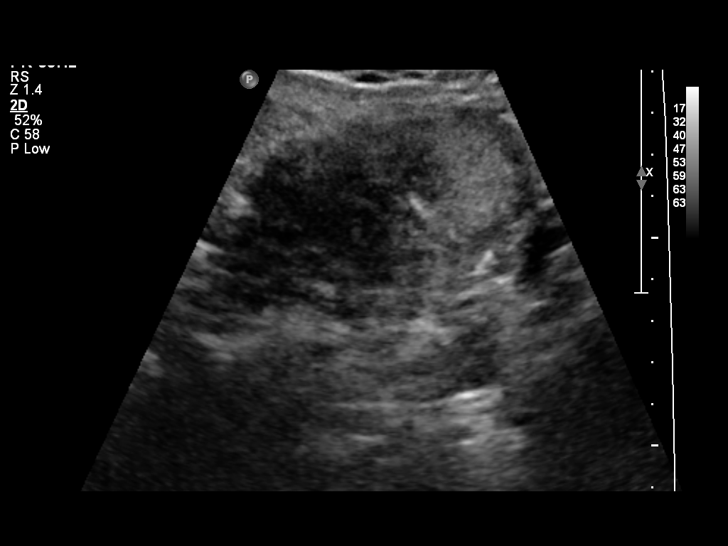
[im 6/23]
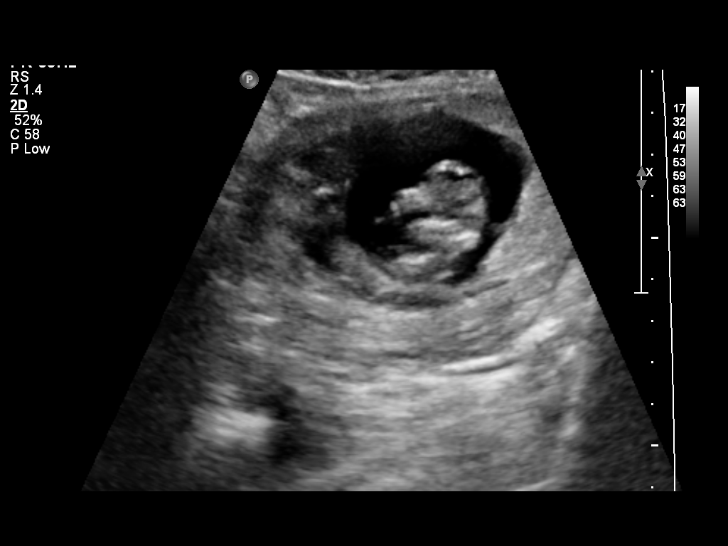
[im 8/23]
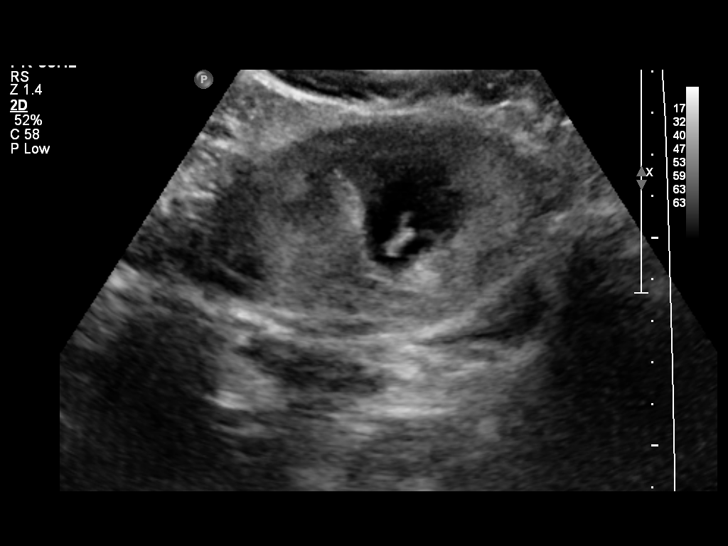
[im 10/23]
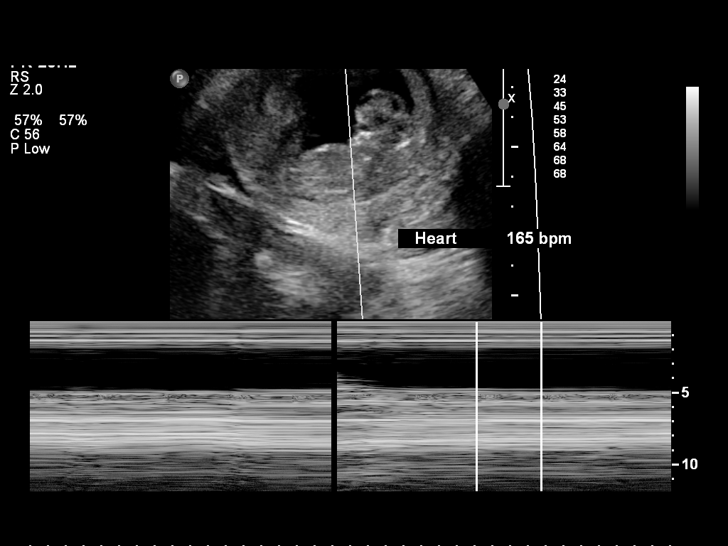
[im 11/23]
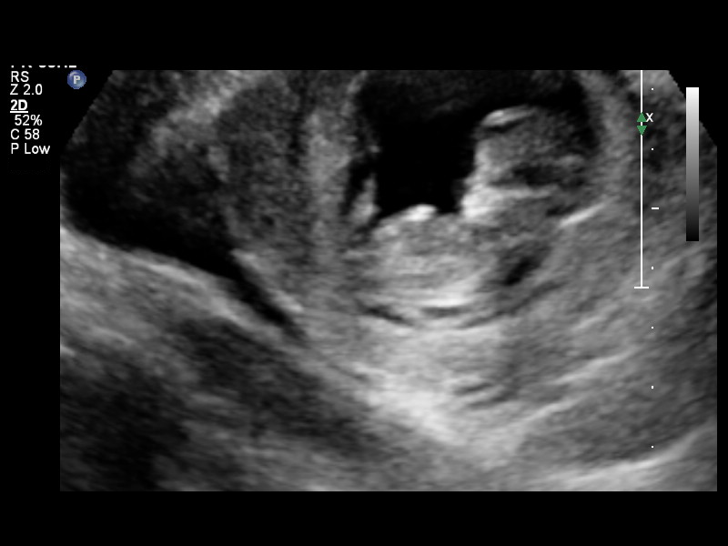
[im 13/23]
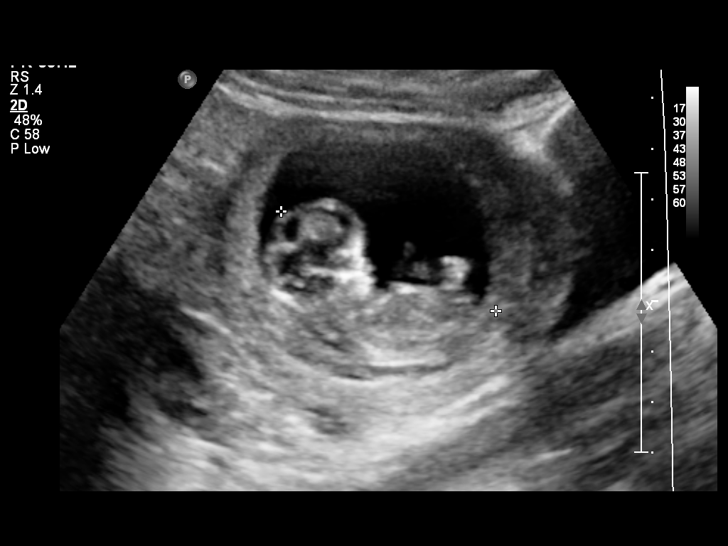
[im 14/23]
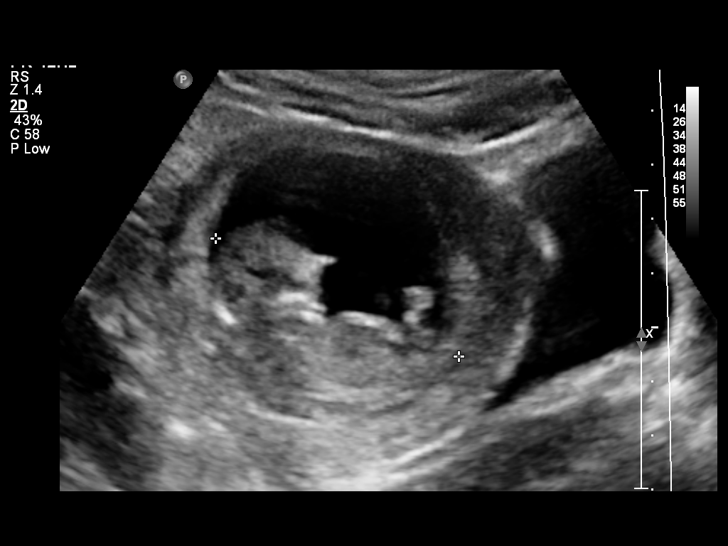
[im 16/23]
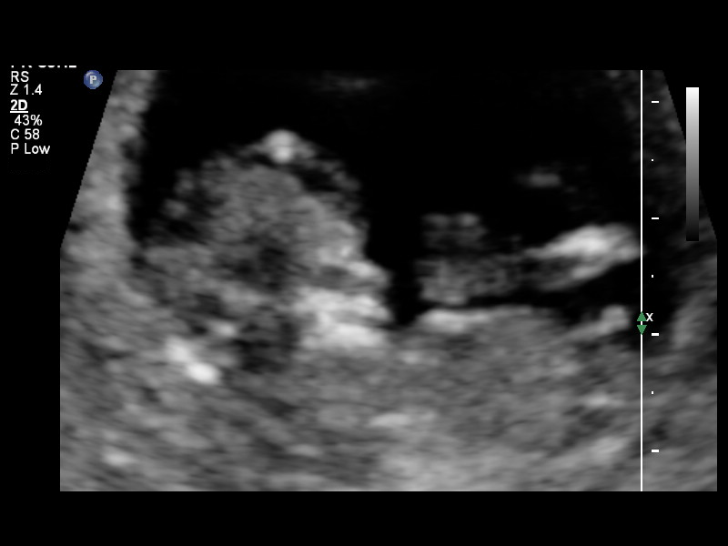
[im 18/23]
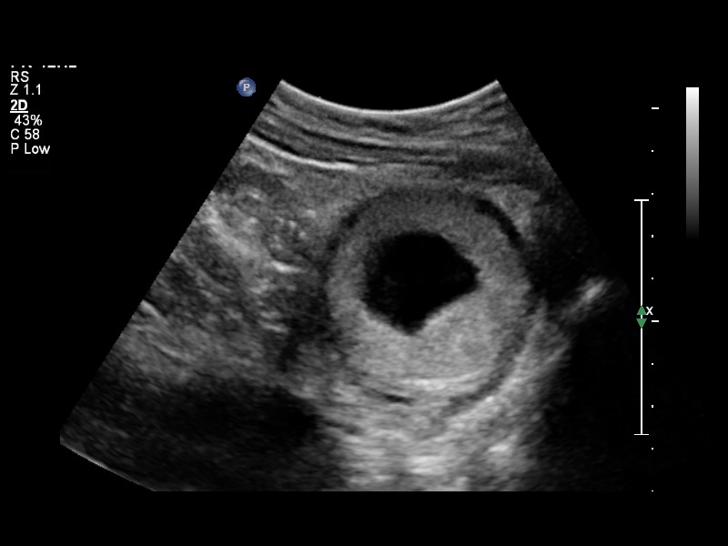
[im 19/23]
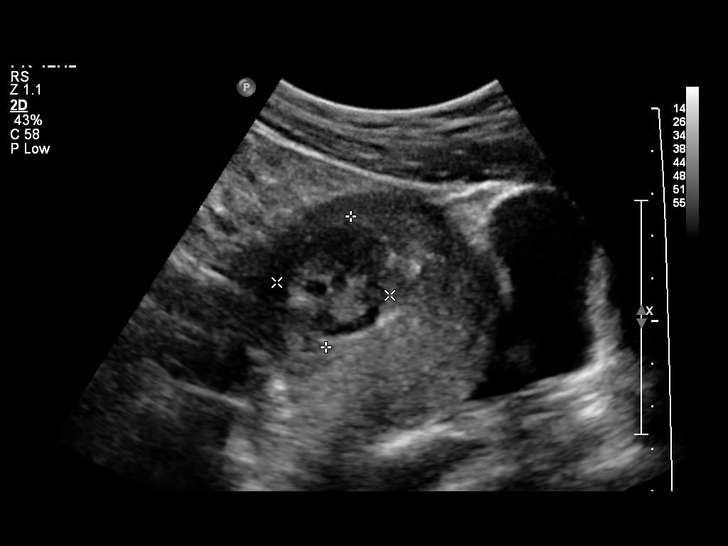
[im 21/23]
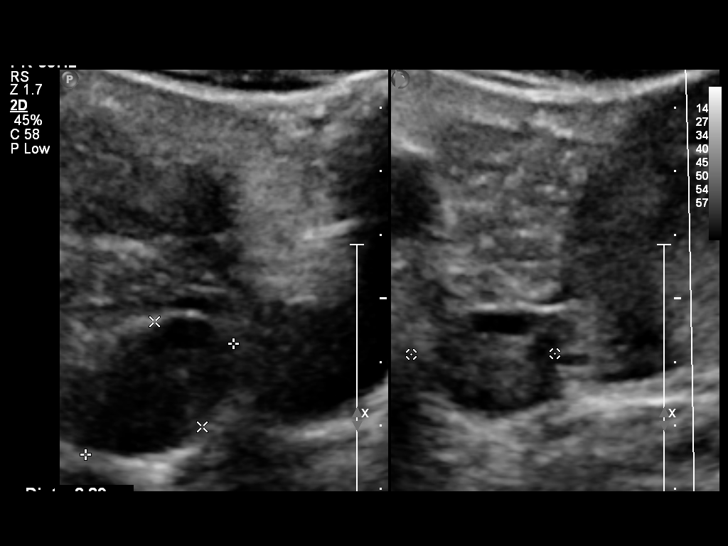
[im 23/23]
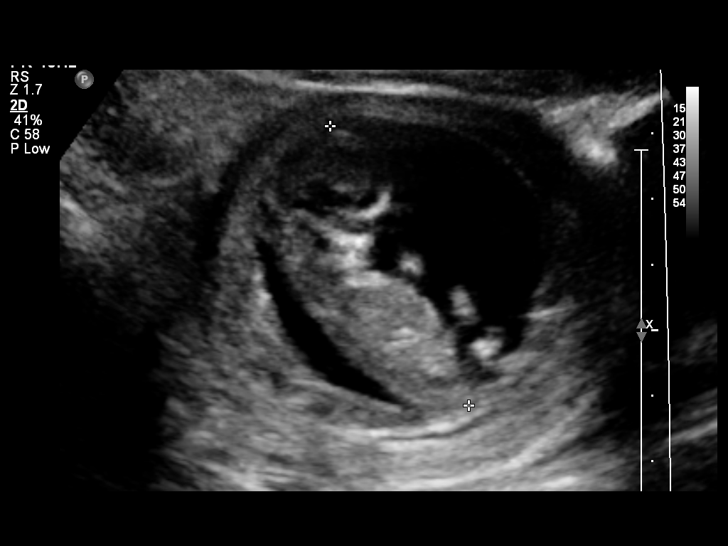

[14 of 23 positions shown; findings below may reference images not displayed]

FINDINGS: Intrauterine gestational sac: Present

Yolk sac:  Present

Embryo:  Present

Cardiac Activity: Present

Heart Rate: 165 bpm

CRL:   48  mm   11 w 5 d                  US EDC: 11/21/2014

Maternal uterus/adnexae: Normal bilateral ovaries. There is a 3.1 x
2.7 x 3.4 cm subchorionic hematoma. No significant free fluid
identified within the pelvis.
IMPRESSION: Single live intrauterine gestation 12 weeks 2 days by LMP.

There is a 3.4 cm subchorionic hematoma.

## 2016-06-20 DIAGNOSIS — H90A12 Conductive hearing loss, unilateral, left ear with restricted hearing on the contralateral side: Secondary | ICD-10-CM | POA: Diagnosis not present

## 2016-06-20 DIAGNOSIS — L723 Sebaceous cyst: Secondary | ICD-10-CM | POA: Diagnosis not present

## 2016-06-20 DIAGNOSIS — H6122 Impacted cerumen, left ear: Secondary | ICD-10-CM | POA: Diagnosis not present

## 2016-06-20 DIAGNOSIS — Z6824 Body mass index (BMI) 24.0-24.9, adult: Secondary | ICD-10-CM | POA: Diagnosis not present

## 2016-12-13 DIAGNOSIS — Z30432 Encounter for removal of intrauterine contraceptive device: Secondary | ICD-10-CM | POA: Diagnosis not present

## 2016-12-17 NOTE — L&D Delivery Note (Signed)
Operative Delivery Note At 5:31 PM a viable female was delivered via Vaginal, Vac[employer], operational(Extractor).  Presentation: vertex; Position: Left,, Occiput,, Anterior; Station: +4.  Verbal consent: obtained from patient.  Risks and benefits discussed in detail.  Risks include, but are not limited to the risks of anesthesia, bleeding, infection, damage to maternal tissues, fetal cephalhematoma.  There is also the risk of inability to effect vaginal delivery of the head, or shoulder dystocia that cannot be resolved by established maneuvers, leading to the need for emergency cesarean section.  Delivery of the head: 10/15/2017  5:30 PM First maneuver: 10/16/2017  5:30 PM, [place]ts Second maneuver: 10/16/2017  5:31 PM, Posterior arm delivered Third maneuver: ,   Fourth maneuver: ,   Fifth maneuver: ,   Sixth maneuver: ,    APGAR: 8, 8; weight  .   Placenta status: , .   Cord:  with the following complications: .  Cord pH: n/a  Anesthesia:  epidural Episiotomy: None Lacerations: 2nd degree;Perineal Suture Repair: 2.0 3.0 vicryl Est. Blood Loss (mL): 350cc   Mom to postpartum.  Baby to Couplet care / Skin to Skin.  Jillian George, Jillian Peace, George 10/16/2017, 5:55 PM

## 2017-02-07 ENCOUNTER — Other Ambulatory Visit (HOSPITAL_COMMUNITY)
Admission: RE | Admit: 2017-02-07 | Discharge: 2017-02-07 | Disposition: A | Discharge status: Home / Self Care | Payer: BLUE CROSS/BLUE SHIELD | Source: Ambulatory Visit | Attending: Obstetrics and Gynecology | Admitting: Obstetrics and Gynecology

## 2017-02-07 ENCOUNTER — Other Ambulatory Visit: Payer: Self-pay | Admitting: Obstetrics & Gynecology

## 2017-02-07 DIAGNOSIS — Z01419 Encounter for gynecological examination (general) (routine) without abnormal findings: Secondary | ICD-10-CM | POA: Insufficient documentation

## 2017-02-08 LAB — CYTOLOGY - PAP: DIAGNOSIS: NEGATIVE

## 2017-03-14 DIAGNOSIS — Z3481 Encounter for supervision of other normal pregnancy, first trimester: Secondary | ICD-10-CM | POA: Diagnosis not present

## 2017-03-19 LAB — OB RESULTS CONSOLE ABO/RH: RH TYPE: POSITIVE

## 2017-03-19 LAB — OB RESULTS CONSOLE HEPATITIS B SURFACE ANTIGEN: Hepatitis B Surface Ag: NEGATIVE

## 2017-03-19 LAB — OB RESULTS CONSOLE HIV ANTIBODY (ROUTINE TESTING): HIV: NONREACTIVE

## 2017-03-19 LAB — OB RESULTS CONSOLE RUBELLA ANTIBODY, IGM: Rubella: IMMUNE

## 2017-03-19 LAB — OB RESULTS CONSOLE ANTIBODY SCREEN: ANTIBODY SCREEN: NEGATIVE

## 2017-03-19 LAB — OB RESULTS CONSOLE RPR: RPR: NONREACTIVE

## 2017-03-29 ENCOUNTER — Encounter (HOSPITAL_COMMUNITY): Payer: Self-pay | Admitting: Emergency Medicine

## 2017-03-29 ENCOUNTER — Ambulatory Visit (HOSPITAL_COMMUNITY)
Admission: EM | Admit: 2017-03-29 | Discharge: 2017-03-29 | Disposition: A | Discharge status: Home / Self Care | Payer: BLUE CROSS/BLUE SHIELD | Attending: Family Medicine | Admitting: Family Medicine

## 2017-03-29 DIAGNOSIS — M791 Myalgia: Secondary | ICD-10-CM

## 2017-03-29 DIAGNOSIS — R05 Cough: Secondary | ICD-10-CM | POA: Diagnosis not present

## 2017-03-29 DIAGNOSIS — Z3A11 11 weeks gestation of pregnancy: Secondary | ICD-10-CM | POA: Diagnosis not present

## 2017-03-29 DIAGNOSIS — J111 Influenza due to unidentified influenza virus with other respiratory manifestations: Secondary | ICD-10-CM | POA: Diagnosis not present

## 2017-03-29 DIAGNOSIS — Z79899 Other long term (current) drug therapy: Secondary | ICD-10-CM | POA: Insufficient documentation

## 2017-03-29 DIAGNOSIS — O99511 Diseases of the respiratory system complicating pregnancy, first trimester: Secondary | ICD-10-CM | POA: Diagnosis not present

## 2017-03-29 LAB — POCT RAPID STREP A: STREPTOCOCCUS, GROUP A SCREEN (DIRECT): NEGATIVE

## 2017-03-29 MED ORDER — ACETAMINOPHEN 325 MG PO TABS
ORAL_TABLET | ORAL | Status: AC
  Filled 2017-03-29: qty 2

## 2017-03-29 MED ORDER — ACETAMINOPHEN 325 MG PO TABS
650.0000 mg | ORAL_TABLET | Freq: Once | ORAL | Status: AC
  Administered 2017-03-29: 650 mg via ORAL

## 2017-03-29 MED ORDER — HYDROCOD POLST-CPM POLST ER 10-8 MG/5ML PO SUER: 5.0000 mL | Freq: Every evening | ORAL | 0 refills | Status: DC | PRN

## 2017-03-29 MED ORDER — OSELTAMIVIR PHOSPHATE 75 MG PO CAPS: 75.0000 mg | ORAL_CAPSULE | Freq: Two times a day (BID) | ORAL | Status: DC

## 2017-03-29 MED ORDER — OSELTAMIVIR PHOSPHATE 75 MG PO CAPS: 75.0000 mg | ORAL_CAPSULE | Freq: Two times a day (BID) | ORAL | 0 refills | Status: DC

## 2017-03-29 NOTE — ED Triage Notes (Signed)
Here for cold sx onset 3 days associated w/ST, fevers, chills, diaphoresis, dry cough, nasal congestion/drainage  Has tried OTC mucin ex and Cepacol drops w/no relief.   A&O x4... NAD

## 2017-03-29 NOTE — Discharge Instructions (Signed)
If your symptoms do not improve in the next 48 hours or you develop chest pain, shortness of breath, or inability to tolerate fluids, seek medical attention right away.

## 2017-03-29 NOTE — ED Provider Notes (Signed)
[facility]  CSN: [id number] Arrival date & time: 03/29/17  1258  History   Chief Complaint Chief Complaint  Patient presents with  . URI    HPI Jillian George is a 31 y.o. [id number] at [redacted]w[redacted]d presenting for URI symptoms.   She reports abrupt onset of nonproductive cough, congestion, fever, chills, myalgias that are severe, constant, not improved with cepacol and mucinex. She's had some emesis, but generally tolerating fluids and pizza last night. No shortness of breath, chest pain, leg swelling. No sick contacts. Nonsmoker, with uncomplicated pregnancy to date. Did not get flu vaccine.    HPI  Past Medical History:  Diagnosis Date  . Chlamydia     Patient Active Problem List   Diagnosis Date Noted  . Indication for care in labor or delivery 11/01/2014    Past Surgical History:  Procedure Laterality Date  . cyst removed from face      OB History    Gravida Para Term Preterm AB Living   SAB TAB Ectopic Multiple Live Births   1 1   0 1       Home Medications    Prior to Admission medications   Medication Sig Start Date End Date Taking? Authorizing Provider  Prenatal Vit-Fe Fumarate-FA (PRENATAL MULTIVITAMIN) TABS tablet Take 1 tablet by mouth daily at 12 noon.   Yes Historical Provider, MD  chlorpheniramine-HYDROcodone (TUSSIONEX PENNKINETIC ER) 10-8 MG/5ML SUER Take 5 mLs by mouth at bedtime as needed for cough. 03/29/17   Tyrone Nine, MD  ferrous sulfate 325 (65 FE) MG tablet Take 1 tablet (325 mg total) by mouth 2 (two) times daily with a meal. 11/02/14   Geryl Rankins, MD  ibuprofen (ADVIL,MOTRIN) 600 MG tablet Take 1 tablet (600 mg total) by mouth every 6 (six) hours. 11/02/14   Geryl Rankins, MD  misoprostol (CYTOTEC) 200 MCG tablet Take 1 tablet (200 mcg total) by mouth every 6 (six) hours. 11/02/14   Geryl Rankins, MD  oseltamivir (TAMIFLU) 75 MG capsule Take 1 capsule (75 mg total) by mouth 2 (two) times daily. Complete 5 days of  treatment. 03/29/17   Tyrone Nine, MD    Family History History reviewed. No pertinent family history.  Social History Social History  Substance Use Topics  . Smoking status: Never Smoker  . Smokeless tobacco: Never Used  . Alcohol use No     Comment: None since March 2015     Allergies   Patient has no known allergies.   Review of Systems Review of Systems In addition to HPI: Constitutional: +fever, chills Respiratory: Negative for dyspnea Cardiovascular: Negative for chest pain or palpitations  Gastrointestinal: Negative for vomiting, diarrhea and constipation Genitourinary: Negative for dysuria and urgency Neurological: Negative for headaches and visual changes No vaginal bleeding, contractions.   Physical Exam Triage Vital Signs ED Triage Vitals  Enc Vitals Group     BP 03/29/17 1315 98/64     Pulse Rate 03/29/17 1315 (!) 115     Resp 03/29/17 1315 16     Temp 03/29/17 1315 (!) 100.9 F (38.3 C)     Temp Source 03/29/17 1315 Oral     SpO2 03/29/17 1315 98 %     Weight --      Height --      Head Circumference --      Peak Flow --      Pain Score 03/29/17 1316 7  Pain Loc --      Pain Edu? --      Excl. in GC? --    No data found.   Updated Vital Signs BP 98/64 (BP Location: Right Arm)   Pulse (!) 115 Comment: reported Heart Rated to CMA [employer] (!) 100.9 F (38.3 C) (Oral) Comment: reported temp. to C[employer]  Resp 16   LMP 01/14/2016   SpO2 98%   Visual Acuity Right Eye Distance:   Left Eye Distance:   Bilateral Distance:    Right Eye Near:   Left Eye Near:    Bilateral Near:     Physical Exam  Constitutional: She is oriented to person, place, and time. She appears well-developed and well-nourished. No distress.  HENT:  Head: Normocephalic and atraumatic.  Right Ear: External ear normal.  Left Ear: External ear normal.  Mouth/Throat: Oropharynx is clear and moist. No oropharyngeal exudate.  Eyes: Conjunctivae and  EOM are normal.  Neck: Neck supple.  Cardiovascular: Normal rate and regular rhythm.   No murmur heard. Pulmonary/Chest: Effort normal and breath sounds normal. No respiratory distress.  Abdominal: Soft. There is no tenderness.  Musculoskeletal: She exhibits no edema.  Neurological: She is alert and oriented to person, place, and time.  Skin: Skin is warm and dry.  Psychiatric: She has a normal mood and affect.  Nursing note and vitals reviewed.    UC Treatments / Results  Labs (all labs ordered are listed, but only abnormal results are displayed) Labs Reviewed  POCT RAPID STREP A    EKG  EKG Interpretation None       Radiology No results found.  Procedures Procedures (including critical care time)  Medications Ordered in UC Medications  oseltamivir (TAMIFLU) capsule 75 mg (not administered)  acetaminophen (TYLENOL) tablet 650 mg (650 mg Oral Given 03/29/17 1325)     Initial Impression / Assessment and Plan / UC Course  I have reviewed the triage vital signs and the nursing notes.  Pertinent labs & imaging results that were available during my care of the patient were reviewed by me and considered in my medical decision making (see chart for details).  Final Clinical Impressions(s) / UC Diagnoses   Final diagnoses:  Influenza   Suspected influenza: Presenting in first 48 hours of symptoms and at high risk for complications due to pregnancy.    - Rx tamiflu 75 mg BID x5 days (normal renal function) - Cough syrup provided with precautions that this has not been proven to cause zero risk in pregnancy. - Return precautions advised  New Prescriptions New Prescriptions   CHLORPHENIRAMINE-HYDROCODONE (TUSSIONEX PENNKINETIC ER) 10-8 MG/5ML SUER    Take 5 mLs by mouth at bedtime as needed for cough.   OSELTAMIVIR (TAMIFLU) 75 MG CAPSULE    Take 1 capsule (75 mg total) by mouth 2 (two) times daily. Complete 5 days of treatment.     Tyrone Nine, MD 03/29/17  [phone]

## 2017-03-31 LAB — CULTURE, GROUP A STREP (THRC)

## 2017-04-04 DIAGNOSIS — Z3481 Encounter for supervision of other normal pregnancy, first trimester: Secondary | ICD-10-CM | POA: Diagnosis not present

## 2017-04-17 DIAGNOSIS — Z3687 Encounter for antenatal screening for uncertain dates: Secondary | ICD-10-CM | POA: Diagnosis not present

## 2017-04-17 DIAGNOSIS — Z3A13 13 weeks gestation of pregnancy: Secondary | ICD-10-CM | POA: Diagnosis not present

## 2017-05-30 DIAGNOSIS — Z3482 Encounter for supervision of other normal pregnancy, second trimester: Secondary | ICD-10-CM | POA: Diagnosis not present

## 2017-05-30 DIAGNOSIS — Z36 Encounter for antenatal screening for chromosomal anomalies: Secondary | ICD-10-CM | POA: Diagnosis not present

## 2017-07-17 DIAGNOSIS — Z3482 Encounter for supervision of other normal pregnancy, second trimester: Secondary | ICD-10-CM | POA: Diagnosis not present

## 2017-08-14 DIAGNOSIS — Z23 Encounter for immunization: Secondary | ICD-10-CM | POA: Diagnosis not present

## 2017-09-18 DIAGNOSIS — Z3483 Encounter for supervision of other normal pregnancy, third trimester: Secondary | ICD-10-CM | POA: Diagnosis not present

## 2017-09-18 DIAGNOSIS — O26843 Uterine size-date discrepancy, third trimester: Secondary | ICD-10-CM | POA: Diagnosis not present

## 2017-09-18 LAB — OB RESULTS CONSOLE GBS: STREP GROUP B AG: NEGATIVE

## 2017-09-29 ENCOUNTER — Inpatient Hospital Stay (HOSPITAL_COMMUNITY)
Admission: AD | Admit: 2017-09-29 | Discharge: 2017-09-29 | Disposition: A | Discharge status: Home / Self Care | Payer: BLUE CROSS/BLUE SHIELD | Source: Ambulatory Visit | Attending: Obstetrics & Gynecology | Admitting: Obstetrics & Gynecology

## 2017-09-29 ENCOUNTER — Encounter (HOSPITAL_COMMUNITY): Payer: Self-pay | Admitting: Emergency Medicine

## 2017-09-29 DIAGNOSIS — O26893 Other specified pregnancy related conditions, third trimester: Secondary | ICD-10-CM | POA: Diagnosis not present

## 2017-09-29 DIAGNOSIS — Z3A37 37 weeks gestation of pregnancy: Secondary | ICD-10-CM | POA: Insufficient documentation

## 2017-09-29 DIAGNOSIS — Z79899 Other long term (current) drug therapy: Secondary | ICD-10-CM | POA: Insufficient documentation

## 2017-09-29 DIAGNOSIS — N898 Other specified noninflammatory disorders of vagina: Secondary | ICD-10-CM

## 2017-09-29 LAB — AMNISURE RUPTURE OF MEMBRANE (ROM) NOT AT ARMC: Amnisure ROM: NEGATIVE

## 2017-09-29 NOTE — MAU Note (Signed)
Urine in lab 

## 2017-09-29 NOTE — Discharge Instructions (Signed)
Fetal Movement Counts °Patient Name: ________________________________________________ Patient Due Date: ____________________ °What is a fetal movement count? °A fetal movement count is the number of times that you feel your baby move during a certain amount of time. This may also be called a fetal kick count. A fetal movement count is recommended for every pregnant woman. You may be asked to start counting fetal movements as early as week 28 of your pregnancy. °Pay attention to when your baby is most active. You may notice your baby's sleep and wake cycles. You may also notice things that make your baby move more. You should do a fetal movement count: °· When your baby is normally most active. °· At the same time each day. ° °A good time to count movements is while you are resting, after having something to eat and drink. °How do I count fetal movements? °1. Find a quiet, comfortable area. Sit, or lie down on your side. °2. Write down the date, the start time and stop time, and the number of movements that you felt between those two times. Take this information with you to your health care visits. °3. For 2 hours, count kicks, flutters, swishes, rolls, and jabs. You should feel at least 10 movements during 2 hours. °4. You may stop counting after you have felt 10 movements. °5. If you do not feel 10 movements in 2 hours, have something to eat and drink. Then, keep resting and counting for 1 hour. If you feel at least 4 movements during that hour, you may stop counting. °Contact a health care provider if: °· You feel fewer than 4 movements in 2 hours. °· Your baby is not moving like he or she usually does. °Date: ____________ Start time: ____________ Stop time: ____________ Movements: ____________ °Date: ____________ Start time: ____________ Stop time: ____________ Movements: ____________ °Date: ____________ Start time: ____________ Stop time: ____________ Movements: ____________ °Date: ____________ Start time:  ____________ Stop time: ____________ Movements: ____________ °Date: ____________ Start time: ____________ Stop time: ____________ Movements: ____________ °Date: ____________ Start time: ____________ Stop time: ____________ Movements: ____________ °Date: ____________ Start time: ____________ Stop time: ____________ Movements: ____________ °Date: ____________ Start time: ____________ Stop time: ____________ Movements: ____________ °Date: ____________ Start time: ____________ Stop time: ____________ Movements: ____________ °This information is not intended to replace advice given to you by your health care provider. Make sure you discuss any questions you have with your health care provider. °Document Released: 01/02/2007 Document Revised: 08/01/2016 Document Reviewed: 01/12/2016 °Elsevier Interactive Patient Education © 2018 [employer] °Braxton Hicks Contractions °Contractions of the uterus can occur throughout pregnancy, but they are not always a sign that you are in labor. You may have practice contractions called Braxton Hicks contractions. These false labor contractions are sometimes confused with true labor. °What are Braxton Hicks contractions? °Braxton Hicks contractions are tightening movements that occur in the muscles of the uterus before labor. Unlike true labor contractions, these contractions do not result in opening (dilation) and thinning of the cervix. Toward the end of pregnancy (32-34 weeks), Braxton Hicks contractions can happen more often and may become stronger. These contractions are sometimes difficult to tell apart from true labor because they can be very uncomfortable. You should not feel embarrassed if you go to the hospital with false labor. °Sometimes, the only way to tell if you are in true labor is for your health care provider to look for changes in the cervix. The health care provider will do a physical exam and may monitor your contractions. If   you are not in true labor, the exam  should show that your cervix is not dilating and your water has not broken. °If there are no prenatal problems or other health problems associated with your pregnancy, it is completely safe for you to be sent home with false labor. You may continue to have Braxton Hicks contractions until you go into true labor. °How can I tell the difference between true labor and false labor? °· Differences °? False labor °? Contractions last 30-70 seconds.: Contractions are usually shorter and not as strong as true labor contractions. °? Contractions become very regular.: Contractions are usually irregular. °? Discomfort is usually felt in the top of the uterus, and it spreads to the lower abdomen and low back.: Contractions are often felt in the front of the lower abdomen and in the groin. °? Contractions do not go away with walking.: Contractions may go away when you walk around or change positions while lying down. °? Contractions usually become more intense and increase in frequency.: Contractions get weaker and are shorter-lasting as time goes on. °? The cervix dilates and gets thinner.: The cervix usually does not dilate or become thin. °Follow these instructions at home: °· Take over-the-counter and prescription medicines only as told by your health care provider. °· Keep up with your usual exercises and follow other instructions from your health care provider. °· Eat and drink lightly if you think you are going into labor. °· If Braxton Hicks contractions are making you uncomfortable: °? Change your position from lying down or resting to walking, or change from walking to resting. °? Sit and rest in a tub of warm water. °? Drink enough fluid to keep your urine clear or pale yellow. Dehydration may cause these contractions. °? Do slow and deep breathing several times an hour. °· Keep all follow-up prenatal visits as told by your health care provider. This is important. °Contact a health care provider if: °· You have a  fever. °· You have continuous pain in your abdomen. °Get help right away if: °· Your contractions become stronger, more regular, and closer together. °· You have fluid leaking or gushing from your vagina. °· You pass blood-tinged mucus (bloody show). °· You have bleeding from your vagina. °· You have low back pain that you never had before. °· You feel your baby’s head pushing down and causing pelvic pressure. °· Your baby is not moving inside you as much as it used to. °Summary °· Contractions that occur before labor are called Braxton Hicks contractions, false labor, or practice contractions. °· Braxton Hicks contractions are usually shorter, weaker, farther apart, and less regular than true labor contractions. True labor contractions usually become progressively stronger and regular and they become more frequent. °· Manage discomfort from Braxton Hicks contractions by changing position, resting in a warm bath, drinking plenty of water, or practicing deep breathing. °This information is not intended to replace advice given to you by your health care provider. Make sure you discuss any questions you have with your health care provider. °Document Released: 12/03/2005 Document Revised: 10/22/2016 Document Reviewed: 10/22/2016 °Elsevier Interactive Patient Education © 2017 Elsevier Inc. ° °

## 2017-09-29 NOTE — MAU Provider Note (Signed)
Cc: Leking fluid  SUBJECTIVE:  Jillian George is a 31 y.o. Z6X0960 at [redacted]w[redacted]d stating she's noticed vaginal wetness over the past 2 days. No gushing of fluid but reports a small spurt occurring about every 2 hours today. She noticed a small area of wetness on the sheets when she got up today. Has not needed to wear peri-pad. No irritative vaginitis or antecedent intercourse. No abdominal pain or contractions. No vaginal bleeding. Good fetal movement. Past Medical History:  Diagnosis Date  . Chlamydia      OB History  Gravida Para Term Preterm AB Living  SAB TAB Ectopic Multiple Live Births  2 1   0 1    # Outcome Date GA Lbr Len/2nd Weight Sex Delivery Anes PTL Lv  5 Current           4 Term 11/01/14 [redacted]w[redacted]d 09:55 / 01:28 5 lb 15.8 oz (2.716 kg) M Vag-Spont EPI  LIV     Birth Comments: NONE  3 SAB           2 TAB           1 SAB              Review of Systems  Constitutional: Negative for fever.  Gastrointestinal: Negative for abdominal pain, nausea and vomiting.  Genitourinary: Negative for dysuria, flank pain, frequency and urgency.  Musculoskeletal: Negative for back pain.  Psychiatric/Behavioral: Negative.  Negative for hallucinations (.o).   OBJECTIVE: Vitals:   09/29/17 1636  BP: 114/71  Pulse: 96  Resp: 17  Temp: 98 F (36.7 C)  SpO2: 100%    Physical Exam  Constitutional: She is well-developed, well-nourished, and in no distress. No distress.  HENT:  Head: Normocephalic.  Cardiovascular: Normal rate.   Pulmonary/Chest: Effort normal.  Abdominal: Soft. There is no tenderness.  Genitourinary:  Genitourinary Comments: SSE: scant clear mucus discharge, neg pooling, neg ferning SVE: ftp/long/-3  Nursing note and vitals reviewed.   Fetal monitoring Baseline 120-125, moderate variability, accelerations present, no decelerations Toco:Slight UI  Results for orders placed or performed during the hospital encounter of 09/29/17 (from the past 24  hour(s))  Amnisure rupture of membrane (rom)not at [facility]     Status: None   Collection Time: 09/29/17  5:40 PM  Result Value Ref Range   Amnisure ROM NEGATIVE      Consult Dr. Charlotta Newton: Amnisure sent> negative  ASSESSMENT: A5W0981 at [redacted]w[redacted]d FWB by EFM No evidence PROM 1. Vaginal discharge during pregnancy in third trimester    Discharge home with reassurance and labor precautions Allergies as of 09/29/2017   No Known Allergies     Medication List    STOP taking these medications   chlorpheniramine-HYDROcodone 10-8 MG/5ML Suer Commonly known as:  TUSSIONEX PENNKINETIC ER   ferrous sulfate 325 (65 FE) MG tablet   ibuprofen 600 MG tablet Commonly known as:  ADVIL,MOTRIN   misoprostol 200 MCG tablet Commonly known as:  CYTOTEC   oseltamivir 75 MG capsule Commonly known as:  TAMIFLU     TAKE these medications   prenatal multivitamin Tabs tablet Take 1 tablet by mouth daily at 12 noon.      Follow-up Information    Myna Hidalgo, DO Follow up.   Specialty:  Obstetrics and Gynecology Why:  Keep scheduled prenatal appointment Contact information: [address] [place] [address] [phone]

## 2017-09-29 NOTE — MAU Note (Signed)
Pt states she has been leaking for 3 days inconsistently. Increased in the leaking today.

## 2017-10-09 ENCOUNTER — Telehealth (HOSPITAL_COMMUNITY): Payer: Self-pay | Admitting: *Deleted

## 2017-10-09 ENCOUNTER — Encounter (HOSPITAL_COMMUNITY): Payer: Self-pay | Admitting: *Deleted

## 2017-10-09 NOTE — Telephone Encounter (Signed)
Preadmission screen  

## 2017-10-14 NOTE — H&P (Signed)
HPI: 31 y/o G4P1021 @ 4922w5d estimated gestational age (as dated by LMP c/w 20 week ultrasound) presents for scheduled IOL.   no Leaking of Fluid,   no Vaginal Bleeding,   irregular Uterine Contractions,  + Fetal Movement.  Prenatal care has been provided by Dr. Charlotta Newtonzan  ROS: no HA, no epigastric pain, no visual changes.    Pregnancy uncomplicated   Prenatal Transfer Tool  Maternal Diabetes: No Genetic Screening: Declined Maternal Ultrasounds/Referrals: Normal Fetal Ultrasounds or other Referrals:  None Maternal Substance Abuse:  No Significant Maternal Medications:  None Significant Maternal Lab Results: Lab values include: Group B Strep negative   PNL:  GBS neg, Rub Immune, Hep B neg, RPR NR, HIV neg, GC/C neg, glucola:69 Hgb: 12.6 Blood type: A positive, antibody neg  Immunizations: Tdap: 8/29 Flu: declined  OBHx: 11/01/2014- @ 38wk- NSVD, 5#15oz, uncomplicated PMHx:  none Meds:  PNV Allergy:  No Known Allergies SurgHx: none SocHx:   no Tobacco, no  EtOH, no Illicit Drugs  O: LMP 01/11/2017 to be completed  Gen. AAOx3, NAD Neck: normal appearance CV.  RRR  No murmur.  Resp. CTAB, no wheeze or crackles. Abd. Gravid,  no tenderness,  no rigidity,  no guarding Extr.  no edema B/L , no calf tenderness, neg Homan's B/L   FHT: 140 by doppler SVE: 1/25/-3, vertex by exam     Labs: see orders  A/P:  31 y.o. Z6X0960G4P1021 @ 7322w5d EGA who presents for IOL -FWB:  Reassuring by doppler -Labor: plan for cytotec per protocol -GBS: neg -Pain management: IV or epidural upon request -CBC, T&S to be obtained  Myna HidalgoJennifer Lavette Yankovich, DO [phone] (pager) [phone] (office)

## 2017-10-16 ENCOUNTER — Inpatient Hospital Stay (HOSPITAL_COMMUNITY)
Admission: RE | Admit: 2017-10-16 | Discharge: 2017-10-18 | DRG: 807 | Disposition: A | Discharge status: Home / Self Care | Payer: BLUE CROSS/BLUE SHIELD | Source: Ambulatory Visit | Attending: Obstetrics & Gynecology | Admitting: Obstetrics & Gynecology

## 2017-10-16 ENCOUNTER — Inpatient Hospital Stay (HOSPITAL_COMMUNITY)
Admission: RE | Admit: 2017-10-16 | Payer: BLUE CROSS/BLUE SHIELD | Source: Ambulatory Visit | Admitting: Obstetrics & Gynecology

## 2017-10-16 ENCOUNTER — Inpatient Hospital Stay (HOSPITAL_COMMUNITY): Payer: BLUE CROSS/BLUE SHIELD | Admitting: Anesthesiology

## 2017-10-16 ENCOUNTER — Encounter (HOSPITAL_COMMUNITY): Payer: Self-pay

## 2017-10-16 DIAGNOSIS — Z3A00 Weeks of gestation of pregnancy not specified: Secondary | ICD-10-CM | POA: Diagnosis not present

## 2017-10-16 DIAGNOSIS — S42024A Nondisplaced fracture of shaft of right clavicle, initial encounter for closed fracture: Secondary | ICD-10-CM | POA: Diagnosis not present

## 2017-10-16 DIAGNOSIS — O26893 Other specified pregnancy related conditions, third trimester: Secondary | ICD-10-CM | POA: Diagnosis not present

## 2017-10-16 DIAGNOSIS — Z3A39 39 weeks gestation of pregnancy: Secondary | ICD-10-CM | POA: Diagnosis not present

## 2017-10-16 DIAGNOSIS — S42302A Unspecified fracture of shaft of humerus, left arm, initial encounter for closed fracture: Secondary | ICD-10-CM | POA: Diagnosis not present

## 2017-10-16 DIAGNOSIS — Z23 Encounter for immunization: Secondary | ICD-10-CM | POA: Diagnosis not present

## 2017-10-16 LAB — CBC
HEMATOCRIT: 35.2 % — AB (ref 36.0–46.0)
HEMOGLOBIN: 11.6 g/dL — AB (ref 12.0–15.0)
MCH: 30.6 pg (ref 26.0–34.0)
MCHC: 33 g/dL (ref 30.0–36.0)
MCV: 92.9 fL (ref 78.0–100.0)
Platelets: 243 10*3/uL (ref 150–400)
RBC: 3.79 MIL/uL — ABNORMAL LOW (ref 3.87–5.11)
RDW: 12.9 % (ref 11.5–15.5)
WBC: 11.3 10*3/uL — ABNORMAL HIGH (ref 4.0–10.5)

## 2017-10-16 LAB — RPR: RPR Ser Ql: NONREACTIVE

## 2017-10-16 LAB — TYPE AND SCREEN
ABO/RH(D): A POS
ANTIBODY SCREEN: NEGATIVE

## 2017-10-16 MED ORDER — DIPHENHYDRAMINE HCL 50 MG/ML IJ SOLN: 12.5000 mg | INTRAMUSCULAR | Status: DC | PRN

## 2017-10-16 MED ORDER — LACTATED RINGERS IV SOLN
INTRAVENOUS | Status: DC
  Administered 2017-10-16 (×3): via INTRAVENOUS

## 2017-10-16 MED ORDER — DIPHENHYDRAMINE HCL 25 MG PO CAPS: 25.0000 mg | ORAL_CAPSULE | Freq: Four times a day (QID) | ORAL | Status: DC | PRN

## 2017-10-16 MED ORDER — OXYTOCIN 40 UNITS IN LACTATED RINGERS INFUSION - SIMPLE MED: 2.5000 [IU]/h | INTRAVENOUS | Status: DC

## 2017-10-16 MED ORDER — SENNOSIDES-DOCUSATE SODIUM 8.6-50 MG PO TABS
2.0000 | ORAL_TABLET | ORAL | Status: DC
  Administered 2017-10-16: 2 via ORAL
  Filled 2017-10-16 (×2): qty 2

## 2017-10-16 MED ORDER — ONDANSETRON HCL 4 MG/2ML IJ SOLN
4.0000 mg | Freq: Four times a day (QID) | INTRAMUSCULAR | Status: DC | PRN
  Administered 2017-10-16: 4 mg via INTRAVENOUS
  Filled 2017-10-16: qty 2

## 2017-10-16 MED ORDER — BENZOCAINE-MENTHOL 20-0.5 % EX AERO
1.0000 "application " | INHALATION_SPRAY | CUTANEOUS | Status: DC | PRN
  Administered 2017-10-18: 1 via TOPICAL
  Filled 2017-10-16: qty 56

## 2017-10-16 MED ORDER — OXYTOCIN BOLUS FROM INFUSION
500.0000 mL | Freq: Once | INTRAVENOUS | Status: AC
  Administered 2017-10-16: 500 mL/h via INTRAVENOUS

## 2017-10-16 MED ORDER — LIDOCAINE-EPINEPHRINE (PF) 2 %-1:200000 IJ SOLN
INTRAMUSCULAR | Status: DC | PRN
  Administered 2017-10-16: 5 mL via EPIDURAL
  Administered 2017-10-16: 3 mL via EPIDURAL

## 2017-10-16 MED ORDER — EPHEDRINE 5 MG/ML INJ
10.0000 mg | INTRAVENOUS | Status: DC | PRN
  Filled 2017-10-16: qty 2

## 2017-10-16 MED ORDER — TERBUTALINE SULFATE 1 MG/ML IJ SOLN
0.2500 mg | Freq: Once | INTRAMUSCULAR | Status: DC | PRN
  Filled 2017-10-16: qty 1

## 2017-10-16 MED ORDER — LACTATED RINGERS IV SOLN
500.0000 mL | INTRAVENOUS | Status: DC | PRN
Start: 2017-10-16 — End: 2017-10-16

## 2017-10-16 MED ORDER — ACETAMINOPHEN 325 MG PO TABS: 650.0000 mg | ORAL_TABLET | ORAL | Status: DC | PRN

## 2017-10-16 MED ORDER — SIMETHICONE 80 MG PO CHEW: 80.0000 mg | CHEWABLE_TABLET | ORAL | Status: DC | PRN

## 2017-10-16 MED ORDER — OXYTOCIN 40 UNITS IN LACTATED RINGERS INFUSION - SIMPLE MED
1.0000 m[IU]/min | INTRAVENOUS | Status: DC
  Administered 2017-10-16: 2 m[IU]/min via INTRAVENOUS
  Filled 2017-10-16: qty 1000

## 2017-10-16 MED ORDER — COCONUT OIL OIL: 1.0000 "application " | TOPICAL_OIL | Status: DC | PRN

## 2017-10-16 MED ORDER — LIDOCAINE HCL (PF) 1 % IJ SOLN
INTRAMUSCULAR | Status: DC | PRN
  Administered 2017-10-16 (×2): 4 mL via EPIDURAL

## 2017-10-16 MED ORDER — LACTATED RINGERS IV SOLN: 500.0000 mL | Freq: Once | INTRAVENOUS | Status: DC

## 2017-10-16 MED ORDER — OXYCODONE-ACETAMINOPHEN 5-325 MG PO TABS: 2.0000 | ORAL_TABLET | ORAL | Status: DC | PRN

## 2017-10-16 MED ORDER — PRENATAL MULTIVITAMIN CH
1.0000 | ORAL_TABLET | Freq: Every day | ORAL | Status: DC
  Administered 2017-10-17 – 2017-10-18 (×2): 1 via ORAL
  Filled 2017-10-16 (×2): qty 1

## 2017-10-16 MED ORDER — MISOPROSTOL 25 MCG QUARTER TABLET
25.0000 ug | ORAL_TABLET | ORAL | Status: DC | PRN
  Administered 2017-10-16 (×2): 25 ug via VAGINAL
  Filled 2017-10-16 (×3): qty 1

## 2017-10-16 MED ORDER — PHENYLEPHRINE 40 MCG/ML (10ML) SYRINGE FOR IV PUSH (FOR BLOOD PRESSURE SUPPORT)
80.0000 ug | PREFILLED_SYRINGE | INTRAVENOUS | Status: DC | PRN
  Filled 2017-10-16: qty 5

## 2017-10-16 MED ORDER — ONDANSETRON HCL 4 MG/2ML IJ SOLN: 4.0000 mg | INTRAMUSCULAR | Status: DC | PRN

## 2017-10-16 MED ORDER — FENTANYL 2.5 MCG/ML BUPIVACAINE 1/10 % EPIDURAL INFUSION (WH - ANES)
14.0000 mL/h | INTRAMUSCULAR | Status: DC | PRN
  Administered 2017-10-16: 14 mL/h via EPIDURAL
  Filled 2017-10-16: qty 100

## 2017-10-16 MED ORDER — IBUPROFEN 600 MG PO TABS
600.0000 mg | ORAL_TABLET | Freq: Four times a day (QID) | ORAL | Status: DC
  Administered 2017-10-16 – 2017-10-18 (×6): 600 mg via ORAL
  Filled 2017-10-16 (×7): qty 1

## 2017-10-16 MED ORDER — PHENYLEPHRINE 40 MCG/ML (10ML) SYRINGE FOR IV PUSH (FOR BLOOD PRESSURE SUPPORT)
80.0000 ug | PREFILLED_SYRINGE | INTRAVENOUS | Status: DC | PRN
Start: 2017-10-16 — End: 2017-10-16
  Filled 2017-10-16: qty 10
  Filled 2017-10-16: qty 5

## 2017-10-16 MED ORDER — OXYCODONE-ACETAMINOPHEN 5-325 MG PO TABS: 1.0000 | ORAL_TABLET | ORAL | Status: DC | PRN

## 2017-10-16 MED ORDER — LACTATED RINGERS IV SOLN
500.0000 mL | Freq: Once | INTRAVENOUS | Status: AC
  Administered 2017-10-16: 500 mL via INTRAVENOUS

## 2017-10-16 MED ORDER — ONDANSETRON HCL 4 MG PO TABS: 4.0000 mg | ORAL_TABLET | ORAL | Status: DC | PRN

## 2017-10-16 MED ORDER — DIBUCAINE 1 % RE OINT: 1.0000 "application " | TOPICAL_OINTMENT | RECTAL | Status: DC | PRN

## 2017-10-16 MED ORDER — WITCH HAZEL-GLYCERIN EX PADS: 1.0000 "application " | MEDICATED_PAD | CUTANEOUS | Status: DC | PRN

## 2017-10-16 MED ORDER — LIDOCAINE HCL (PF) 1 % IJ SOLN
30.0000 mL | INTRAMUSCULAR | Status: DC | PRN
  Filled 2017-10-16: qty 30

## 2017-10-16 MED ORDER — SOD CITRATE-CITRIC ACID 500-334 MG/5ML PO SOLN: 30.0000 mL | ORAL | Status: DC | PRN

## 2017-10-16 MED ORDER — ZOLPIDEM TARTRATE 5 MG PO TABS: 5.0000 mg | ORAL_TABLET | Freq: Every evening | ORAL | Status: DC | PRN

## 2017-10-16 NOTE — Progress Notes (Signed)
OB PN:  S: Starting to feel more painful contractions  O: BP 113/65   Pulse (!) 56   Temp 97.9 F (36.6 C) (Oral)   Resp 16   Ht 5\' 6"  (1.676 m)   Wt 77.1 kg (170 lb)   LMP 01/11/2017   BMI 27.44 kg/m   FHT: 140bpm, moderate variablity, + accels, no decels Toco: q1-483min SVE: 3/60/-2, AROM clear fluid  A/P: 31 y.o. Z6X0960G4P1021 @ 3843w5d for IOL 1. FWB: Cat. I 2. Labor: continue to Pit Pain: plan for epidural GBS: neg  Myna HidalgoJennifer Jerni Selmer, DO [phone] (pager) [phone] (office)

## 2017-10-16 NOTE — Progress Notes (Signed)
OB PN:  S: Resting comfortably, noting some nausea  O: BP (!) 102/55   Pulse 87   Temp 97.7 F (36.5 C) (Oral)   Resp 18   Ht 5\' 6"  (1.676 m)   Wt 77.1 kg (170 lb)   LMP 01/11/2017   BMI 27.44 kg/m   FHT: 140bpm, moderate variablity, + accels, occasional variable decels  Toco: q1-803min SVE: C/C/+1- +2  A/P: 31 y.o. G9F6213G4P1021 @ 2381w5d for IOL 1. FWB: Cat. II- pt repositioned, overall FHT ressuring 2. Labor: expectant management, no Pit currently, plan to start pushing Pain: plan for epidural GBS: neg  Myna HidalgoJennifer Sri Clegg, DO [phone] (pager) [phone] (office)

## 2017-10-16 NOTE — Anesthesia Pain Management Evaluation Note (Signed)
  CRNA Pain Management Visit Note  Patient: Jillian George, 31 y.o., female  "Hello [facility] am a [facility]. We have an anesthesia team available at all times to provide care throughout the hospital, including epidural management and anesthesia for C-section. I don't know your plan for the delivery whether it a natural birth, water birth, IV sedation, nitrous supplementation, doula or epidural, but we want to meet your pain goals."   1.Was your pain managed to your expectations on prior hospitalizations?   Yes   2.What is your expectation for pain management during this hospitalization?     Epidural  3.How can we help you reach that goal?   Record the patient's initial score and the patient's pain goal.   Pain: 0  Pain Goal: 6 [facility] wants you to be able to say your pain was always managed very well.  Jillian George 10/16/2017

## 2017-10-16 NOTE — Progress Notes (Signed)
OB PN:  S: Pt resting comfortably this am  O: BP 113/65   Pulse (!) 54   Temp 98.2 F (36.8 C) (Oral)   Resp 18   Ht 5\' 6"  (1.676 m)   Wt 77.1 kg (170 lb)   LMP 01/11/2017   BMI 27.44 kg/m   FHT: 115bpm, moderate variablity, + accels, no decels Toco: irregular SVE: deferred  A/P: 31 y.o. Z6X0960G4P1021 @ 698w5d for IOL 1. FWB: Cat. I 2. Labor: plan to transition to Pit Pain: IV or epidural upon request GBS: neg  Myna HidalgoJennifer Taelor Moncada, DO [phone] (pager) [phone] (office)

## 2017-10-16 NOTE — Anesthesia Procedure Notes (Signed)
Epidural Patient location during procedure: OB Start time: 10/16/2017 1:11 PM  Staffing Anesthesiologist: Mal AmabileFOSTER, Lavance Beazer Performed: anesthesiologist   Preanesthetic Checklist Completed: patient identified, site marked, surgical consent, pre-op evaluation, timeout performed, IV checked, risks and benefits discussed and monitors and equipment checked  Epidural Patient position: sitting Prep: site prepped and draped and DuraPrep Patient monitoring: continuous pulse ox and blood pressure Approach: midline Location: L3-L4 Injection technique: LOR air  Needle:  Needle type: Tuohy  Needle gauge: 17 G Needle length: 9 cm and 9 Needle insertion depth: 5 cm cm Catheter type: closed end flexible Catheter size: 19 Gauge Catheter at skin depth: 10 cm Test dose: negative and Other  Assessment Events: blood not aspirated, injection not painful, no injection resistance, negative IV test and no paresthesia  Additional Notes Patient identified. Risks and benefits discussed including failed block, incomplete  Pain control, post dural puncture headache, nerve damage, paralysis, blood pressure Changes, nausea, vomiting, reactions to medications-both toxic and allergic and post Partum back pain. All questions were answered. Patient expressed understanding and wished to proceed. Sterile technique was used throughout procedure. Epidural site was Dressed with sterile barrier dressing. No paresthesias, signs of intravascular injection Or signs of intrathecal spread were encountered.  Patient was more comfortable after the epidural was dosed. Please see RN's note for documentation of vital signs and FHR which are stable.

## 2017-10-16 NOTE — Anesthesia Preprocedure Evaluation (Addendum)
Anesthesia Evaluation  Patient identified by MRN, date of birth, ID band Patient awake    Reviewed: Allergy & Precautions, Patient's Chart, lab work & pertinent test results  Airway Mallampati: II  TM Distance: >3 FB Neck ROM: Full    Dental no notable dental hx. (+) Teeth Intact   Pulmonary neg pulmonary ROS,    Pulmonary exam normal breath sounds clear to auscultation       Cardiovascular negative cardio ROS Normal cardiovascular exam Rhythm:Regular Rate:Normal     Neuro/Psych negative neurological ROS  negative psych ROS   GI/Hepatic negative GI ROS, Neg liver ROS,   Endo/Other  negative endocrine ROS  Renal/GU negative Renal ROS  negative genitourinary   Musculoskeletal negative musculoskeletal ROS (+)   Abdominal   Peds  Hematology negative hematology ROS (+)   Anesthesia Other Findings   Reproductive/Obstetrics                             Anesthesia Physical Anesthesia Plan  ASA: II  Anesthesia Plan: Epidural   Post-op Pain Management:    Induction:   PONV Risk Score and Plan:   Airway Management Planned: Natural Airway  Additional Equipment:   Intra-op Plan:   Post-operative Plan: Extubation in OR  Informed Consent: I have reviewed the patients History and Physical, chart, labs and discussed the procedure including the risks, benefits and alternatives for the proposed anesthesia with the patient or authorized representative who has indicated his/her understanding and acceptance.     Plan Discussed with: Anesthesiologist  Anesthesia Plan Comments:         Anesthesia Quick Evaluation

## 2017-10-17 LAB — CBC
HEMATOCRIT: 29.7 % — AB (ref 36.0–46.0)
Hemoglobin: 10.3 g/dL — ABNORMAL LOW (ref 12.0–15.0)
MCH: 31.4 pg (ref 26.0–34.0)
MCHC: 34.7 g/dL (ref 30.0–36.0)
MCV: 90.5 fL (ref 78.0–100.0)
PLATELETS: 211 10*3/uL (ref 150–400)
RBC: 3.28 MIL/uL — ABNORMAL LOW (ref 3.87–5.11)
RDW: 13.2 % (ref 11.5–15.5)
WBC: 15.7 10*3/uL — AB (ref 4.0–10.5)

## 2017-10-17 NOTE — Anesthesia Postprocedure Evaluation (Signed)
Anesthesia Post Note  Patient: Jillian George  Procedure(s) Performed: AN AD HOC LABOR EPIDURAL     Patient location during evaluation: Mother Baby Anesthesia Type: Epidural Level of consciousness: awake Pain management: pain level controlled Vital Signs Assessment: post-procedure vital signs reviewed and stable Respiratory status: spontaneous breathing Cardiovascular status: stable Postop Assessment: no headache, epidural receding and patient able to bend at knees Anesthetic complications: no    Last Vitals:  Vitals:   10/16/17 2348 10/17/17 0644  BP: 114/64 103/68  Pulse: 66 68  Resp: 16 18  Temp: 36.6 C 36.5 C    Last Pain:  Vitals:   10/17/17 0644  TempSrc:   PainSc: 2    Pain Goal:                 Edison PaceWILKERSON,Wyvonne Carda

## 2017-10-17 NOTE — Lactation Note (Signed)
This note was copied from a baby's chart. Lactation Consultation Note  Patient Name: Boy Erinn Klima Today's Date: 10/17/2017 Reason for consult: Initial assessment;NICU baby  NICU baby 5218 hours old. Mom had 2 small, active, children in the room during consultation. Mom has colostrum sitting at bedside and reports that baby is supposed to be coming from NICU to her room soon. Mom states that she is getting more colostrum when she hand expresses than when she pumps. Mom given manual pump with review. Enc mom to put baby to breast with cues, calling for assistance as needed. Enc mom to give EBM to baby after baby at breast, and to post-pumping followed by hand/manual expression. Mom aware of OP/BFSG and LC phone line assistance after D/C.   Maternal Data Has patient been taught Hand Expression?: Yes Does the patient have breastfeeding experience prior to this delivery?: Yes  Feeding Feeding Type: Bottle Fed - Formula Nipple Type: Slow - flow Length of feed: 20 min  LATCH Score                   Interventions Interventions: Hand pump  Lactation Tools Discussed/Used Tools: Pump Breast pump type: Double-Electric Breast Pump Pump Review: Setup, frequency, and cleaning;Milk Storage Initiated by:: bedside RN Date initiated:: 10/16/17   Consult Status Consult Status: Follow-up Date: 10/18/17 Follow-up type: In-patient    Sherlyn HayJennifer D Iona Stay 10/17/2017, 12:22 PM

## 2017-10-17 NOTE — Progress Notes (Signed)
Postpartum Note Day # 1  S:  Patient resting comfortable in bed.  Pain controlled.  Tolerating general diet. + flatus, no BM.  Lochia moderate.  Ambulating without difficulty.  She denies n/v/f/c, SOB, or CP.  Pt plans on breastfeeding.  O: Temp:  [97.5 F (36.4 C)-98 F (36.7 C)] 97.7 F (36.5 C) (11/01 0644) Pulse Rate:  [63-135] 68 (11/01 0644) Resp:  [14-18] 18 (11/01 0644) BP: (99-123)/(50-76) 103/68 (11/01 16100644)   Gen: A&Ox3, NAD CV: RRR, no MRG Resp: CTAB Abdomen: soft, NT, ND  Uterus: firm, non-tender, below umbilicus Incision: c/d/i, bandage on Ext: No edema, no calf tenderness bilaterally  Labs:  Recent Labs  10/16/17 0100 10/17/17 0620  HGB 11.6* 10.3*    A/P: Pt is a 31 y.o. G2P2002 s/p VAVD, PPD#1  - Pain well controlled -GU: UOP is adequate -GI: Tolerating general diet -Activity: encouraged sitting up to chair and ambulation as tolerated -Prophylaxis: early ambulation -Labs: stable as above -Baby boy in NICU  DISPO: Continue with routine postpartum care  Jillian HidalgoJennifer Aziyah Provencal, DO [phone]) [phone] (office)

## 2017-10-18 ENCOUNTER — Inpatient Hospital Stay (HOSPITAL_COMMUNITY): Admit: 2017-10-18 | Payer: Self-pay | Admitting: Obstetrics & Gynecology

## 2017-10-18 MED ORDER — IBUPROFEN 600 MG PO TABS: 600.0000 mg | ORAL_TABLET | Freq: Four times a day (QID) | ORAL | 0 refills | Status: DC

## 2017-10-18 NOTE — Lactation Note (Signed)
This note was copied from a baby's chart. Lactation Consultation Note  Patient Name: Jillian George Today's Date: 10/18/2017  Mom is attempting breast but states baby not showing much interest and latching for brief periods.  Baby is also receiving formula per bottle.  Mom reports pumping this AM but not obtaining colostrum.  Baby is 40 hours old and colostrum was obtained yesterday.  Reassured and discussed milk coming to volume.  Instructed to pump both breasts every 2-3 hours if baby not feeding well.  Encouraged to call out for BF assist if desired with next feeding.  Lactation outpatient services and support reviewed.   Maternal Data    Feeding    LATCH Score                   Interventions    Lactation Tools Discussed/Used     Consult Status      Huston FoleyMOULDEN, Elmin Wiederholt S 10/18/2017, 9:40 AM

## 2017-10-18 NOTE — Progress Notes (Signed)
Postpartum Note Day # 2 S:  Patient resting comfortable in bed.  Pain controlled.  Tolerating general diet. + flatus, + BM.  Lochia moderate and improved since yesterday.  Ambulating without difficulty.  She denies n/v/f/c, SOB, or CP.  Pt plans on breastfeeding.  O: Temp:  [97.7 F (36.5 C)-98.7 F (37.1 C)] 98.7 F (37.1 C) (11/01 1726) Pulse Rate:  [68-70] 70 (11/01 1726) Resp:  [16-18] 16 (11/01 1726) BP: (103)/(59-68) 103/59 (11/01 1726) SpO2:  [100 %] 100 % (11/01 1726)   Gen: A&Ox3, NAD CV: RRR Resp: CTAB Abdomen: soft, NT, ND  Uterus: firm, non-tender, below umbilicus Ext: No edema, no calf tenderness bilaterally  Labs:   Recent Labs  10/16/17 0100 10/17/17 0620  HGB 11.6* 10.3*    A/P: Pt is a 31 y.o. G2P2002 s/p VAVD, PPD#2  - Pain well controlled -GU: Voiding freely -GI: Tolerating general diet -Activity: encouraged sitting up to chair and ambulation as tolerated -Prophylaxis: early ambulation -Labs: stable as above  DISPO: Plan for discharge home today as she is meeting postpartum milestones appropriately  Myna HidalgoJennifer Naiah Donahoe, DO [phone] (pager) [phone] (office)

## 2017-10-18 NOTE — Discharge Instructions (Signed)

## 2017-10-30 NOTE — Discharge Summary (Signed)
OB Discharge Summary     Patient Name: Jillian George DOB: 1986/02/02 MRN: [id number]  Date of admission: 10/16/2017 Delivering MD: Myna HidalgoZAN, Barbra Miner   Date of discharge: 10/18/2017  Admitting diagnosis: INDUCTION Intrauterine pregnancy: 5419w5d     Secondary diagnosis:  Active Problems:   Labor and delivery, indication for care  Additional problems: Shoulder dystocia     Discharge diagnosis: Term Pregnancy Delivered                                                                                                Post partum procedures:n/a  Augmentation: AROM, Pitocin and Cytotec  Complications: None  Hospital course:  Induction of Labor With Vaginal Delivery   31 y.o. yo [id number] at 5619w5d was admitted to the hospital 10/16/2017 for induction of labor.  Indication for induction: Favorable cervix at term.  Patient had an uncomplicated labor course as follows: Membrane Rupture Time/Date: 12:37 PM ,10/16/2017   Intrapartum Procedures: Episiotomy: None [1]                                         Lacerations:  2nd degree [3];Perineal [11]  Patient had delivery of a Viable infant.  Information for the patient's newborn:  Spring, Sawyer Elijah [[id number]][[id number]]  Delivery Method: Vaginal, Vacuum (Extractor)(Filed from Delivery Summary)   10/16/2017  Details of delivery can be found in separate delivery note.  Delivery complicated by shoulder dystocia- see noted.  Patient had a routine postpartum course. Patient is discharged home 10/30/17.  Physical exam  Vitals:   10/16/17 2348 10/17/17 0644 10/17/17 1726 10/18/17 0637  BP: 114/64 103/68 (!) 103/59 107/62  Pulse: 66 68 70 72  Resp: 16 18 16 16   Temp: 97.9 F (36.6 C) 97.7 F (36.5 C) 98.7 F (37.1 C) 98.1 F (36.7 C)  TempSrc:   Oral Oral  SpO2:   100% 99%  Weight:      Height:       General: alert, cooperative and no distress Lochia: appropriate Uterine Fundus: firm Incision: N/A DVT Evaluation: No evidence of DVT seen on  physical exam. Labs: Lab Results  Component Value Date   WBC 15.7 (H) 10/17/2017   HGB 10.3 (L) 10/17/2017   HCT 29.7 (L) 10/17/2017   MCV 90.5 10/17/2017   PLT 211 10/17/2017   CMP Latest Ref Rng & Units 09/15/2011  Glucose 70 - 99 mg/dL 696(E115(H)  BUN 6 - 23 mg/dL 6  Creatinine 9.520.50 - 8.411.10 mg/dL 3.241.00  Sodium 401135 - 027145 mEq/L 144  Potassium 3.5 - 5.1 mEq/L 3.3(L)  Chloride 96 - 112 mEq/L 106    Discharge instruction: per After Visit Summary and "Baby and Me Booklet".  After visit meds:  Allergies as of 10/18/2017   No Known Allergies     Medication List    TAKE these medications   ibuprofen 600 MG tablet Commonly known as:  ADVIL,MOTRIN Take 1 tablet (600 mg total) by mouth every 6 (six) hours.   prenatal multivitamin Tabs tablet  Take 1 tablet by mouth daily at 12 noon.       Diet: routine diet  Activity: Advance as tolerated. Pelvic rest for 6 weeks.   Outpatient follow up:6 weeks Follow up Appt:No future appointments. Follow up Visit:No Follow-up on file.  Postpartum contraception: Progesterone only pills  Newborn Data: Live born female  Birth Weight: 8 lb 9.9 oz (3910 g) APGAR: 8, 8  Newborn Delivery   Time head delivered:  10/15/2017 17:30:00 Birth date/time:  10/16/2017 17:31:00 Delivery type:  Vaginal, Vacuum (Extractor)     Baby Feeding: Breast Disposition:home with mother   10/30/2017 Myna HidalgoZAN, Lavena Loretto, M, DO

## 2017-12-12 DIAGNOSIS — Z975 Presence of (intrauterine) contraceptive device: Secondary | ICD-10-CM | POA: Diagnosis not present

## 2017-12-12 DIAGNOSIS — Z30430 Encounter for insertion of intrauterine contraceptive device: Secondary | ICD-10-CM | POA: Diagnosis not present

## 2018-01-10 ENCOUNTER — Encounter (HOSPITAL_COMMUNITY): Payer: Self-pay

## 2018-01-21 DIAGNOSIS — Z30431 Encounter for routine checking of intrauterine contraceptive device: Secondary | ICD-10-CM | POA: Diagnosis not present

## 2018-07-01 DIAGNOSIS — W57XXXA Bitten or stung by nonvenomous insect and other nonvenomous arthropods, initial encounter: Secondary | ICD-10-CM | POA: Diagnosis not present

## 2018-07-01 DIAGNOSIS — L720 Epidermal cyst: Secondary | ICD-10-CM | POA: Diagnosis not present

## 2018-07-07 DIAGNOSIS — L720 Epidermal cyst: Secondary | ICD-10-CM | POA: Diagnosis not present

## 2018-08-20 DIAGNOSIS — S93601A Unspecified sprain of right foot, initial encounter: Secondary | ICD-10-CM | POA: Diagnosis not present

## 2018-08-25 DIAGNOSIS — S92352A Displaced fracture of fifth metatarsal bone, left foot, initial encounter for closed fracture: Secondary | ICD-10-CM | POA: Diagnosis not present

## 2018-08-27 DIAGNOSIS — M79671 Pain in right foot: Secondary | ICD-10-CM | POA: Diagnosis not present

## 2018-09-24 DIAGNOSIS — S92351A Displaced fracture of fifth metatarsal bone, right foot, initial encounter for closed fracture: Secondary | ICD-10-CM | POA: Diagnosis not present

## 2018-10-22 DIAGNOSIS — S92351D Displaced fracture of fifth metatarsal bone, right foot, subsequent encounter for fracture with routine healing: Secondary | ICD-10-CM | POA: Diagnosis not present

## 2018-10-23 DIAGNOSIS — L905 Scar conditions and fibrosis of skin: Secondary | ICD-10-CM | POA: Diagnosis not present

## 2018-11-26 DIAGNOSIS — S92351D Displaced fracture of fifth metatarsal bone, right foot, subsequent encounter for fracture with routine healing: Secondary | ICD-10-CM | POA: Diagnosis not present

## 2019-08-19 ENCOUNTER — Other Ambulatory Visit: Payer: Self-pay | Admitting: *Deleted

## 2019-08-19 DIAGNOSIS — Z20822 Contact with and (suspected) exposure to covid-19: Secondary | ICD-10-CM

## 2019-08-20 LAB — NOVEL CORONAVIRUS, NAA: SARS-CoV-2, NAA: NOT DETECTED

## 2019-10-05 ENCOUNTER — Other Ambulatory Visit: Payer: Self-pay

## 2019-10-05 DIAGNOSIS — Z20822 Contact with and (suspected) exposure to covid-19: Secondary | ICD-10-CM

## 2019-10-08 LAB — NOVEL CORONAVIRUS, NAA: SARS-CoV-2, NAA: DETECTED — AB

## 2024-09-22 ENCOUNTER — Encounter: Payer: Self-pay | Admitting: Obstetrics & Gynecology

## 2024-09-22 ENCOUNTER — Other Ambulatory Visit (HOSPITAL_COMMUNITY)
Admission: RE | Admit: 2024-09-22 | Discharge: 2024-09-22 | Disposition: A | Discharge status: Home / Self Care | Payer: Self-pay | Source: Ambulatory Visit | Attending: Obstetrics & Gynecology | Admitting: Obstetrics & Gynecology

## 2024-09-22 ENCOUNTER — Ambulatory Visit (INDEPENDENT_AMBULATORY_CARE_PROVIDER_SITE_OTHER): Payer: Self-pay | Admitting: Obstetrics & Gynecology

## 2024-09-22 VITALS — BP 109/68 | HR 73 | Ht 65.0 in | Wt 149.4 lb

## 2024-09-22 DIAGNOSIS — Z01419 Encounter for gynecological examination (general) (routine) without abnormal findings: Secondary | ICD-10-CM

## 2024-09-22 DIAGNOSIS — Z975 Presence of (intrauterine) contraceptive device: Secondary | ICD-10-CM

## 2024-09-22 DIAGNOSIS — Z1331 Encounter for screening for depression: Secondary | ICD-10-CM

## 2024-09-22 DIAGNOSIS — Z124 Encounter for screening for malignant neoplasm of cervix: Secondary | ICD-10-CM

## 2024-09-22 DIAGNOSIS — Z1151 Encounter for screening for human papillomavirus (HPV): Secondary | ICD-10-CM

## 2024-09-22 NOTE — Addendum Note (Signed)
 Addended by: Dolphus Linch I on: 09/22/2024 09:55 AM   Modules accepted: Orders

## 2024-09-22 NOTE — Progress Notes (Signed)
   WELL-WOMAN EXAMINATION Patient name: Jillian George MRN [id number]  Date of birth: 1986-02-09 Chief Complaint:   Annual Exam  History of Present Illness:   Jillian George is a 38 y.o. [phone] female being seen today for a routine well-woman exam.   Mirena  in place- occasional/irregular spotting, but minimal.  No acute gyn concerns.  Denies irregular discharge, itching or irritation.  Denies pelvic or abdominal pain.  Reports no acute GYN concerns   Patient's last menstrual period was 09/17/2024 (exact date).  The current method of family planning is IUD in place since 2018   Last pap due today.  Last mammogram: NA. Last colonoscopy: NA     09/22/2024    9:13 AM  Depression screen PHQ 2/9  Decreased Interest 1  Down, Depressed, Hopeless 0  PHQ - 2 Score 1  Altered sleeping 1  Tired, decreased energy 2  Change in appetite 0  Feeling bad or failure about yourself  0  Trouble concentrating 0  Moving slowly or fidgety/restless 0  Suicidal thoughts 0  PHQ-9 Score 4      Review of Systems:   Pertinent items are noted in HPI Denies any headaches, blurred vision, fatigue, shortness of breath, chest pain, abdominal pain, bowel movements, urination, or intercourse unless otherwise stated above.  Pertinent History Reviewed:  Reviewed past medical,surgical, social and family history.  Reviewed problem list, medications and allergies. Physical Assessment:   Vitals:   09/22/24 0904  BP: 109/68  Pulse: 73  Weight: 149 lb 6.4 oz (67.8 kg)  Height: 5' 5 (1.651 m)  Body mass index is 24.86 kg/m.        Physical Examination:   General appearance - well appearing, and in no distress  Mental status - alert, oriented to person, place, and time  Psych:  She has a normal mood and affect  Skin - warm and dry, normal color, no suspicious lesions noted  Chest - effort normal, all lung fields clear to auscultation bilaterally  Heart - normal rate and regular rhythm  Neck:   midline trachea, no thyromegaly or nodules  Breasts - breasts appear normal, no suspicious masses, no skin or nipple changes or  axillary nodes  Abdomen - soft, nontender, nondistended, no masses or organomegaly  Pelvic - VULVA: normal appearing vulva with no masses, tenderness or lesions  VAGINA: normal appearing vagina with normal color and discharge, no lesions  CERVIX: normal appearing cervix without discharge or lesions, no CMT, strings visualized at cervical os  Thin prep pap is done with HR HPV cotesting  UTERUS: uterus is felt to be normal size, shape, consistency and nontender   ADNEXA: No adnexal masses or tenderness noted.  Extremities:  No swelling or varicosities noted  Chaperone: Aleck Blase     Assessment & Plan:  1) Well-Woman Exam -pap collected, reviewed screening guidelines -  2) IUD in place -due for replacement next year  No orders of the defined types were placed in this encounter.   Meds: No orders of the defined types were placed in this encounter.   Follow-up: Return in about 1 year (around 09/22/2025) for Annual/IUD insertion with Dr. Autumnrose Yore.   Wyona Neils, DO Attending Obstetrician & Gynecologist, [facility] for [employer], [facility]

## 2024-09-23 ENCOUNTER — Ambulatory Visit: Payer: Self-pay | Admitting: Obstetrics & Gynecology

## 2024-09-23 LAB — CYTOLOGY - PAP
Comment: NEGATIVE
Diagnosis: NEGATIVE
High risk HPV: NEGATIVE
